# Patient Record
Sex: Female | Born: 1980 | Race: White | Hispanic: No | Marital: Single | Smoking: Current every day smoker
Health system: Southern US, Community
[De-identification: ages and names within clinical notes are randomized; demographics above are authoritative.]

## PROBLEM LIST (undated history)

## (undated) DIAGNOSIS — N809 Endometriosis, unspecified: Secondary | ICD-10-CM

## (undated) DIAGNOSIS — A630 Anogenital (venereal) warts: Secondary | ICD-10-CM

## (undated) DIAGNOSIS — E039 Hypothyroidism, unspecified: Secondary | ICD-10-CM

## (undated) DIAGNOSIS — E079 Disorder of thyroid, unspecified: Secondary | ICD-10-CM

## (undated) DIAGNOSIS — G8929 Other chronic pain: Secondary | ICD-10-CM

## (undated) DIAGNOSIS — F329 Major depressive disorder, single episode, unspecified: Secondary | ICD-10-CM

## (undated) DIAGNOSIS — F32A Depression, unspecified: Secondary | ICD-10-CM

## (undated) DIAGNOSIS — F319 Bipolar disorder, unspecified: Secondary | ICD-10-CM

## (undated) HISTORY — PX: OTHER SURGICAL HISTORY: SHX169

---

## 2008-11-01 ENCOUNTER — Emergency Department: Payer: Self-pay | Admitting: Emergency Medicine

## 2010-08-16 ENCOUNTER — Inpatient Hospital Stay: Payer: Self-pay | Admitting: Psychiatry

## 2011-02-20 ENCOUNTER — Emergency Department: Payer: Self-pay | Admitting: Internal Medicine

## 2011-02-23 ENCOUNTER — Emergency Department: Payer: Self-pay | Admitting: Emergency Medicine

## 2011-11-10 ENCOUNTER — Emergency Department: Payer: Self-pay | Admitting: Emergency Medicine

## 2012-02-08 ENCOUNTER — Inpatient Hospital Stay: Payer: Self-pay | Admitting: Psychiatry

## 2012-02-08 LAB — CBC
HCT: 42.5 % (ref 35.0–47.0)
HGB: 14.3 g/dL (ref 12.0–16.0)
MCV: 90 fL (ref 80–100)
WBC: 7.8 10*3/uL (ref 3.6–11.0)

## 2012-02-08 LAB — COMPREHENSIVE METABOLIC PANEL
Albumin: 3.9 g/dL (ref 3.4–5.0)
Alkaline Phosphatase: 65 U/L (ref 50–136)
Bilirubin,Total: 0.2 mg/dL (ref 0.2–1.0)
Chloride: 109 mmol/L — ABNORMAL HIGH (ref 98–107)
Creatinine: 0.8 mg/dL (ref 0.60–1.30)
EGFR (African American): >60
Osmolality: 289 (ref 275–301)
Potassium: 3.7 mmol/L (ref 3.5–5.1)
Sodium: 145 mmol/L (ref 136–145)
Total Protein: 7.8 g/dL (ref 6.4–8.2)

## 2012-02-08 LAB — DRUG SCREEN, URINE
Benzodiazepine, Ur Scrn: POSITIVE (ref ?–200)
Methadone, Ur Screen: NEGATIVE (ref ?–300)
Opiate, Ur Screen: NEGATIVE (ref ?–300)
Phencyclidine (PCP) Ur S: NEGATIVE (ref ?–25)

## 2012-02-08 LAB — ETHANOL
Ethanol %: <0.003 % (ref 0.000–0.080)
Ethanol: <3 mg/dL

## 2012-02-08 LAB — SALICYLATE LEVEL: Salicylates, Serum: 1.9 mg/dL

## 2012-02-08 LAB — ACETAMINOPHEN LEVEL: Acetaminophen: <2 ug/mL

## 2012-02-10 LAB — LIPID PANEL
Ldl Cholesterol, Calc: 56 mg/dL (ref 0–100)
Triglycerides: 189 mg/dL (ref 0–200)

## 2012-03-04 ENCOUNTER — Ambulatory Visit: Payer: Self-pay

## 2012-05-03 LAB — DRUG SCREEN, URINE
Amphetamines, Ur Screen: NEGATIVE (ref ?–1000)
Barbiturates, Ur Screen: NEGATIVE (ref ?–200)
Benzodiazepine, Ur Scrn: NEGATIVE (ref ?–200)
Cocaine Metabolite,Ur ~~LOC~~: NEGATIVE (ref ?–300)
MDMA (Ecstasy)Ur Screen: POSITIVE (ref ?–500)
Methadone, Ur Screen: NEGATIVE (ref ?–300)
Tricyclic, Ur Screen: NEGATIVE (ref ?–1000)

## 2012-05-03 LAB — ETHANOL: Ethanol %: <0.003 % (ref 0.000–0.080)

## 2012-05-03 LAB — CBC
HGB: 14.2 g/dL (ref 12.0–16.0)
MCH: 29.9 pg (ref 26.0–34.0)
MCHC: 34.2 g/dL (ref 32.0–36.0)
MCV: 87 fL (ref 80–100)
Platelet: 247 10*3/uL (ref 150–440)
RDW: 13.4 % (ref 11.5–14.5)
WBC: 6.9 10*3/uL (ref 3.6–11.0)

## 2012-05-03 LAB — COMPREHENSIVE METABOLIC PANEL
Albumin: 3.8 g/dL (ref 3.4–5.0)
Anion Gap: 3 — ABNORMAL LOW (ref 7–16)
Bilirubin,Total: 0.2 mg/dL (ref 0.2–1.0)
Creatinine: 0.93 mg/dL (ref 0.60–1.30)
EGFR (African American): >60
EGFR (Non-African Amer.): >60
SGPT (ALT): 24 U/L
Total Protein: 7.6 g/dL (ref 6.4–8.2)

## 2012-05-03 LAB — URINALYSIS, COMPLETE
Bilirubin,UR: NEGATIVE
Glucose,UR: NEGATIVE mg/dL (ref 0–75)
Ketone: NEGATIVE
Squamous Epithelial: NONE SEEN
WBC UR: NONE SEEN /HPF (ref 0–5)

## 2012-05-03 LAB — ACETAMINOPHEN LEVEL: Acetaminophen: <2 ug/mL

## 2012-05-05 ENCOUNTER — Inpatient Hospital Stay: Payer: Self-pay | Admitting: Psychiatry

## 2012-06-24 ENCOUNTER — Emergency Department: Payer: Self-pay | Admitting: Emergency Medicine

## 2012-06-24 LAB — COMPREHENSIVE METABOLIC PANEL
Anion Gap: 6 — ABNORMAL LOW (ref 7–16)
BUN: 25 mg/dL — ABNORMAL HIGH (ref 7–18)
Chloride: 103 mmol/L (ref 98–107)
Creatinine: 0.93 mg/dL (ref 0.60–1.30)
EGFR (African American): >60
Potassium: 4.1 mmol/L (ref 3.5–5.1)
Total Protein: 8.2 g/dL (ref 6.4–8.2)

## 2012-06-24 LAB — URINALYSIS, COMPLETE
Bilirubin,UR: NEGATIVE
Ketone: NEGATIVE
Protein: NEGATIVE
RBC,UR: NONE SEEN /HPF (ref 0–5)
WBC UR: <1 /HPF (ref 0–5)

## 2012-06-24 LAB — PREGNANCY, URINE: Pregnancy Test, Urine: NEGATIVE m[IU]/mL

## 2012-06-24 LAB — CBC
Platelet: 251 10*3/uL (ref 150–440)
RDW: 13.9 % (ref 11.5–14.5)
WBC: 7.8 10*3/uL (ref 3.6–11.0)

## 2012-06-24 LAB — LIPASE, BLOOD: Lipase: 215 U/L (ref 73–393)

## 2012-06-26 ENCOUNTER — Emergency Department: Payer: Self-pay | Admitting: Emergency Medicine

## 2012-06-26 LAB — CBC WITH DIFFERENTIAL/PLATELET
Basophil #: 0 10*3/uL (ref 0.0–0.1)
Eosinophil %: 7.4 %
Lymphocyte #: 1.6 10*3/uL (ref 1.0–3.6)
MCH: 30.5 pg (ref 26.0–34.0)
MCV: 91 fL (ref 80–100)
Monocyte #: 0.4 x10 3/mm (ref 0.2–0.9)
Platelet: 217 10*3/uL (ref 150–440)
RDW: 13.8 % (ref 11.5–14.5)

## 2012-06-26 LAB — BASIC METABOLIC PANEL
BUN: 12 mg/dL (ref 7–18)
Creatinine: 0.84 mg/dL (ref 0.60–1.30)
EGFR (Non-African Amer.): >60
Glucose: 87 mg/dL (ref 65–99)
Osmolality: 279 (ref 275–301)
Potassium: 3.9 mmol/L (ref 3.5–5.1)
Sodium: 140 mmol/L (ref 136–145)

## 2012-06-26 LAB — HEPATIC FUNCTION PANEL A (ARMC)
Albumin: 3.7 g/dL (ref 3.4–5.0)
Bilirubin, Direct: <0.1 mg/dL (ref 0.00–0.20)

## 2012-06-26 LAB — URINALYSIS, COMPLETE
Bacteria: NONE SEEN
Blood: NEGATIVE
Glucose,UR: NEGATIVE mg/dL (ref 0–75)
Nitrite: NEGATIVE
Ph: 6 (ref 4.5–8.0)
Specific Gravity: 1.019 (ref 1.003–1.030)
Squamous Epithelial: 3

## 2012-07-09 ENCOUNTER — Inpatient Hospital Stay: Payer: Self-pay | Admitting: Psychiatry

## 2012-07-09 LAB — COMPREHENSIVE METABOLIC PANEL
Alkaline Phosphatase: 92 U/L (ref 50–136)
BUN: 13 mg/dL (ref 7–18)
Bilirubin,Total: 0.6 mg/dL (ref 0.2–1.0)
Chloride: 105 mmol/L (ref 98–107)
Creatinine: 0.87 mg/dL (ref 0.60–1.30)
SGPT (ALT): 60 U/L (ref 12–78)
Total Protein: 8.1 g/dL (ref 6.4–8.2)

## 2012-07-09 LAB — CBC
HGB: 15 g/dL (ref 12.0–16.0)
MCH: 30.7 pg (ref 26.0–34.0)
MCHC: 34.4 g/dL (ref 32.0–36.0)

## 2012-07-09 LAB — ETHANOL
Ethanol %: <0.003 % (ref 0.000–0.080)
Ethanol: <3 mg/dL

## 2012-07-09 LAB — URINALYSIS, COMPLETE
Hyaline Cast: 1
Nitrite: NEGATIVE
Protein: 30
RBC,UR: 2 /HPF (ref 0–5)
Squamous Epithelial: 10

## 2012-07-09 LAB — DRUG SCREEN, URINE
Amphetamines, Ur Screen: NEGATIVE (ref ?–1000)
Cannabinoid 50 Ng, Ur ~~LOC~~: NEGATIVE (ref ?–50)
Phencyclidine (PCP) Ur S: NEGATIVE (ref ?–25)

## 2012-07-09 LAB — TSH: Thyroid Stimulating Horm: 52.9 u[IU]/mL — ABNORMAL HIGH

## 2012-07-09 LAB — PREGNANCY, URINE: Pregnancy Test, Urine: NEGATIVE m[IU]/mL

## 2012-07-14 LAB — COMPREHENSIVE METABOLIC PANEL
BUN: 17 mg/dL (ref 7–18)
Bilirubin,Total: 0.3 mg/dL (ref 0.2–1.0)
Chloride: 102 mmol/L (ref 98–107)
Creatinine: 0.73 mg/dL (ref 0.60–1.30)
EGFR (African American): >60
EGFR (Non-African Amer.): >60
Potassium: 3.8 mmol/L (ref 3.5–5.1)
SGPT (ALT): 92 U/L — ABNORMAL HIGH (ref 12–78)
Total Protein: 7.3 g/dL (ref 6.4–8.2)

## 2012-07-14 LAB — CARBAMAZEPINE LEVEL, TOTAL: Carbamazepine: 15.3 ug/mL (ref 4.0–12.0)

## 2012-07-14 LAB — CBC WITH DIFFERENTIAL/PLATELET
Basophil #: 0.1 10*3/uL (ref 0.0–0.1)
Lymphocyte #: 1.9 10*3/uL (ref 1.0–3.6)
Lymphocyte %: 35 %
Monocyte %: 7.5 %
Neutrophil %: 47.3 %
Platelet: 245 10*3/uL (ref 150–440)
RDW: 13.5 % (ref 11.5–14.5)
WBC: 5.5 10*3/uL (ref 3.6–11.0)

## 2012-07-15 LAB — HEPATIC FUNCTION PANEL A (ARMC)
Albumin: 3.8 g/dL (ref 3.4–5.0)
Alkaline Phosphatase: 107 U/L (ref 50–136)
Bilirubin, Direct: <0.1 mg/dL (ref 0.00–0.20)
Total Protein: 7.9 g/dL (ref 6.4–8.2)

## 2012-09-01 LAB — CBC
HCT: 43.3 % (ref 35.0–47.0)
HGB: 15.2 g/dL (ref 12.0–16.0)
MCH: 30.8 pg (ref 26.0–34.0)
MCHC: 35.1 g/dL (ref 32.0–36.0)
MCV: 88 fL (ref 80–100)
Platelet: 284 10*3/uL (ref 150–440)
RBC: 4.94 10*6/uL (ref 3.80–5.20)
RDW: 13.4 % (ref 11.5–14.5)
WBC: 7.1 10*3/uL (ref 3.6–11.0)

## 2012-09-01 LAB — COMPREHENSIVE METABOLIC PANEL
BUN: 14 mg/dL (ref 7–18)
Bilirubin,Total: 0.3 mg/dL (ref 0.2–1.0)
Chloride: 111 mmol/L — ABNORMAL HIGH (ref 98–107)
Creatinine: 0.79 mg/dL (ref 0.60–1.30)
SGPT (ALT): 33 U/L (ref 12–78)
Total Protein: 7.9 g/dL (ref 6.4–8.2)

## 2012-09-01 LAB — HCG, QUANTITATIVE, PREGNANCY: Beta Hcg, Quant.: <1 m[IU]/mL — ABNORMAL LOW

## 2012-09-01 LAB — SALICYLATE LEVEL: Salicylates, Serum: 3.6 mg/dL — ABNORMAL HIGH

## 2012-09-01 LAB — ETHANOL
Ethanol %: <0.003 % (ref 0.000–0.080)
Ethanol: <3 mg/dL

## 2012-09-01 LAB — ACETAMINOPHEN LEVEL: Acetaminophen: <2 ug/mL

## 2012-09-02 ENCOUNTER — Inpatient Hospital Stay: Payer: Self-pay | Admitting: Psychiatry

## 2012-09-02 LAB — DRUG SCREEN, URINE
Amphetamines, Ur Screen: NEGATIVE (ref ?–1000)
Barbiturates, Ur Screen: NEGATIVE (ref ?–200)
Phencyclidine (PCP) Ur S: NEGATIVE (ref ?–25)
Tricyclic, Ur Screen: NEGATIVE (ref ?–1000)

## 2012-09-02 LAB — URINALYSIS, COMPLETE
Bacteria: NONE SEEN
Bilirubin,UR: NEGATIVE
Blood: NEGATIVE
Glucose,UR: NEGATIVE mg/dL (ref 0–75)
Ph: 8 (ref 4.5–8.0)
Specific Gravity: 1.009 (ref 1.003–1.030)
Squamous Epithelial: 1

## 2012-12-16 LAB — URINALYSIS, COMPLETE
Bacteria: NONE SEEN
Glucose,UR: NEGATIVE mg/dL (ref 0–75)
Nitrite: NEGATIVE
Ph: 5 (ref 4.5–8.0)
Protein: NEGATIVE
RBC,UR: 1 /HPF (ref 0–5)

## 2012-12-16 LAB — COMPREHENSIVE METABOLIC PANEL
Alkaline Phosphatase: 108 U/L (ref 50–136)
BUN: 15 mg/dL (ref 7–18)
Calcium, Total: 8.9 mg/dL (ref 8.5–10.1)
Chloride: 111 mmol/L — ABNORMAL HIGH (ref 98–107)
Creatinine: 0.93 mg/dL (ref 0.60–1.30)
EGFR (African American): >60
EGFR (Non-African Amer.): >60
Glucose: 88 mg/dL (ref 65–99)
Osmolality: 280 (ref 275–301)
Potassium: 3.8 mmol/L (ref 3.5–5.1)
SGOT(AST): 19 U/L (ref 15–37)
SGPT (ALT): 30 U/L (ref 12–78)
Sodium: 140 mmol/L (ref 136–145)
Total Protein: 8.3 g/dL — ABNORMAL HIGH (ref 6.4–8.2)

## 2012-12-16 LAB — CBC
HGB: 15.2 g/dL (ref 12.0–16.0)
MCH: 29.8 pg (ref 26.0–34.0)
MCHC: 33.7 g/dL (ref 32.0–36.0)
MCV: 88 fL (ref 80–100)
RDW: 13.4 % (ref 11.5–14.5)

## 2012-12-16 LAB — ACETAMINOPHEN LEVEL: Acetaminophen: <2 ug/mL

## 2012-12-16 LAB — ETHANOL
Ethanol %: <0.003 % (ref 0.000–0.080)
Ethanol: <3 mg/dL

## 2012-12-16 LAB — PREGNANCY, URINE: Pregnancy Test, Urine: NEGATIVE m[IU]/mL

## 2012-12-16 LAB — DRUG SCREEN, URINE
Barbiturates, Ur Screen: NEGATIVE (ref ?–200)
Benzodiazepine, Ur Scrn: NEGATIVE (ref ?–200)
Methadone, Ur Screen: NEGATIVE (ref ?–300)

## 2012-12-16 LAB — SALICYLATE LEVEL: Salicylates, Serum: 3.9 mg/dL — ABNORMAL HIGH

## 2012-12-17 ENCOUNTER — Inpatient Hospital Stay: Payer: Self-pay | Admitting: Psychiatry

## 2013-01-31 ENCOUNTER — Emergency Department: Payer: Self-pay | Admitting: Internal Medicine

## 2013-01-31 LAB — CBC WITH DIFFERENTIAL/PLATELET
Basophil #: 0.1 10*3/uL (ref 0.0–0.1)
Basophil %: 1.1 %
Eosinophil %: 5 %
HCT: 40.8 % (ref 35.0–47.0)
HGB: 14.2 g/dL (ref 12.0–16.0)
Lymphocyte #: 1.9 10*3/uL (ref 1.0–3.6)
Lymphocyte %: 23.6 %
MCH: 30 pg (ref 26.0–34.0)
MCHC: 34.7 g/dL (ref 32.0–36.0)
Monocyte #: 0.6 x10 3/mm (ref 0.2–0.9)
Monocyte %: 8 %
Neutrophil %: 62.3 %
Platelet: 270 10*3/uL (ref 150–440)
RBC: 4.73 10*6/uL (ref 3.80–5.20)
RDW: 13 % (ref 11.5–14.5)
WBC: 8.1 10*3/uL (ref 3.6–11.0)

## 2013-01-31 LAB — COMPREHENSIVE METABOLIC PANEL
Albumin: 3.9 g/dL (ref 3.4–5.0)
Alkaline Phosphatase: 105 U/L (ref 50–136)
Anion Gap: 4 — ABNORMAL LOW (ref 7–16)
Calcium, Total: 8.8 mg/dL (ref 8.5–10.1)
Chloride: 109 mmol/L — ABNORMAL HIGH (ref 98–107)
Co2: 26 mmol/L (ref 21–32)
EGFR (African American): >60
EGFR (Non-African Amer.): >60
Osmolality: 277 (ref 275–301)
Potassium: 3.5 mmol/L (ref 3.5–5.1)
Sodium: 139 mmol/L (ref 136–145)
Total Protein: 7.9 g/dL (ref 6.4–8.2)

## 2013-02-13 LAB — CBC
HCT: 42.3 % (ref 35.0–47.0)
HGB: 14.4 g/dL (ref 12.0–16.0)
MCH: 29.6 pg (ref 26.0–34.0)
MCHC: 34 g/dL (ref 32.0–36.0)
Platelet: 255 10*3/uL (ref 150–440)
WBC: 7.1 10*3/uL (ref 3.6–11.0)

## 2013-02-13 LAB — COMPREHENSIVE METABOLIC PANEL
Albumin: 3.5 g/dL (ref 3.4–5.0)
Anion Gap: 6 — ABNORMAL LOW (ref 7–16)
BUN: 18 mg/dL (ref 7–18)
Bilirubin,Total: 0.2 mg/dL (ref 0.2–1.0)
Calcium, Total: 8.7 mg/dL (ref 8.5–10.1)
Chloride: 113 mmol/L — ABNORMAL HIGH (ref 98–107)
Potassium: 3.7 mmol/L (ref 3.5–5.1)
SGPT (ALT): 21 U/L (ref 12–78)

## 2013-02-13 LAB — URINALYSIS, COMPLETE
Bacteria: NONE SEEN
Bilirubin,UR: NEGATIVE
Blood: NEGATIVE
Ketone: NEGATIVE
Leukocyte Esterase: NEGATIVE
Nitrite: NEGATIVE
Protein: NEGATIVE
RBC,UR: 1 /HPF (ref 0–5)
Squamous Epithelial: NONE SEEN
WBC UR: <1 /HPF (ref 0–5)

## 2013-02-13 LAB — TSH: Thyroid Stimulating Horm: 10.8 u[IU]/mL — ABNORMAL HIGH

## 2013-02-13 LAB — DRUG SCREEN, URINE
Amphetamines, Ur Screen: NEGATIVE (ref ?–1000)
Benzodiazepine, Ur Scrn: NEGATIVE (ref ?–200)
Cocaine Metabolite,Ur ~~LOC~~: NEGATIVE (ref ?–300)
Methadone, Ur Screen: NEGATIVE (ref ?–300)
Phencyclidine (PCP) Ur S: NEGATIVE (ref ?–25)
Tricyclic, Ur Screen: NEGATIVE (ref ?–1000)

## 2013-02-13 LAB — ETHANOL
Ethanol %: <0.003 % (ref 0.000–0.080)
Ethanol: <3 mg/dL

## 2013-02-14 ENCOUNTER — Inpatient Hospital Stay: Payer: Self-pay | Admitting: Psychiatry

## 2013-03-10 ENCOUNTER — Emergency Department: Payer: Self-pay | Admitting: Emergency Medicine

## 2013-03-10 LAB — DRUG SCREEN, URINE
Amphetamines, Ur Screen: NEGATIVE (ref ?–1000)
Barbiturates, Ur Screen: NEGATIVE (ref ?–200)
Cannabinoid 50 Ng, Ur ~~LOC~~: NEGATIVE (ref ?–50)
Cocaine Metabolite,Ur ~~LOC~~: NEGATIVE (ref ?–300)
MDMA (Ecstasy)Ur Screen: POSITIVE (ref ?–500)
Methadone, Ur Screen: NEGATIVE (ref ?–300)
Opiate, Ur Screen: POSITIVE (ref ?–300)

## 2013-03-10 LAB — URINALYSIS, COMPLETE
Bacteria: NONE SEEN
Bilirubin,UR: NEGATIVE
Blood: NEGATIVE
Nitrite: NEGATIVE
Ph: 5 (ref 4.5–8.0)
Protein: NEGATIVE
RBC,UR: 1 /HPF (ref 0–5)
Specific Gravity: 1.016 (ref 1.003–1.030)

## 2013-03-10 LAB — COMPREHENSIVE METABOLIC PANEL
Albumin: 3.8 g/dL (ref 3.4–5.0)
Anion Gap: 5 — ABNORMAL LOW (ref 7–16)
BUN: 17 mg/dL (ref 7–18)
Bilirubin,Total: 0.2 mg/dL (ref 0.2–1.0)
Calcium, Total: 9 mg/dL (ref 8.5–10.1)
Chloride: 107 mmol/L (ref 98–107)
Creatinine: 0.84 mg/dL (ref 0.60–1.30)
EGFR (Non-African Amer.): >60
Glucose: 80 mg/dL (ref 65–99)
Osmolality: 280 (ref 275–301)
SGOT(AST): 33 U/L (ref 15–37)
SGPT (ALT): 69 U/L (ref 12–78)

## 2013-03-10 LAB — CBC
HCT: 42.3 % (ref 35.0–47.0)
HGB: 14.3 g/dL (ref 12.0–16.0)
MCH: 29 pg (ref 26.0–34.0)
MCV: 86 fL (ref 80–100)
Platelet: 260 10*3/uL (ref 150–440)
RBC: 4.92 10*6/uL (ref 3.80–5.20)
RDW: 13.7 % (ref 11.5–14.5)
WBC: 9.2 10*3/uL (ref 3.6–11.0)

## 2013-03-10 LAB — PREGNANCY, URINE: Pregnancy Test, Urine: NEGATIVE m[IU]/mL

## 2013-03-10 LAB — TSH: Thyroid Stimulating Horm: 19.6 u[IU]/mL — ABNORMAL HIGH

## 2013-03-10 LAB — SALICYLATE LEVEL: Salicylates, Serum: 3.2 mg/dL — ABNORMAL HIGH

## 2013-03-15 ENCOUNTER — Inpatient Hospital Stay: Payer: Self-pay | Admitting: Psychiatry

## 2013-03-15 LAB — COMPREHENSIVE METABOLIC PANEL
Albumin: 3.7 g/dL (ref 3.4–5.0)
Anion Gap: 6 — ABNORMAL LOW (ref 7–16)
BUN: 11 mg/dL (ref 7–18)
Bilirubin,Total: 0.4 mg/dL (ref 0.2–1.0)
Chloride: 112 mmol/L — ABNORMAL HIGH (ref 98–107)
Co2: 22 mmol/L (ref 21–32)
EGFR (Non-African Amer.): >60
Potassium: 3.5 mmol/L (ref 3.5–5.1)
SGOT(AST): 20 U/L (ref 15–37)
Sodium: 140 mmol/L (ref 136–145)

## 2013-03-15 LAB — DRUG SCREEN, URINE
Benzodiazepine, Ur Scrn: NEGATIVE (ref ?–200)
Cannabinoid 50 Ng, Ur ~~LOC~~: NEGATIVE (ref ?–50)
Cocaine Metabolite,Ur ~~LOC~~: NEGATIVE (ref ?–300)
MDMA (Ecstasy)Ur Screen: POSITIVE (ref ?–500)
Methadone, Ur Screen: NEGATIVE (ref ?–300)
Opiate, Ur Screen: NEGATIVE (ref ?–300)
Phencyclidine (PCP) Ur S: NEGATIVE (ref ?–25)

## 2013-03-15 LAB — CBC WITH DIFFERENTIAL/PLATELET
Basophil #: 0.1 10*3/uL (ref 0.0–0.1)
Eosinophil #: 0.1 10*3/uL (ref 0.0–0.7)
Eosinophil %: 1 %
HCT: 42.5 % (ref 35.0–47.0)
HGB: 14.3 g/dL (ref 12.0–16.0)
Lymphocyte #: 1.4 10*3/uL (ref 1.0–3.6)
Lymphocyte %: 16 %
MCH: 29.1 pg (ref 26.0–34.0)
MCHC: 33.7 g/dL (ref 32.0–36.0)
MCV: 86 fL (ref 80–100)
Monocyte %: 7.9 %
Platelet: 255 10*3/uL (ref 150–440)
RBC: 4.92 10*6/uL (ref 3.80–5.20)

## 2013-03-15 LAB — URINALYSIS, COMPLETE
Bacteria: NONE SEEN
Glucose,UR: NEGATIVE mg/dL (ref 0–75)
Ketone: NEGATIVE
Leukocyte Esterase: NEGATIVE
Nitrite: NEGATIVE
Ph: 6 (ref 4.5–8.0)
Protein: NEGATIVE
RBC,UR: <1 /HPF (ref 0–5)
Specific Gravity: 1.006 (ref 1.003–1.030)

## 2013-03-15 LAB — SALICYLATE LEVEL: Salicylates, Serum: 9.1 mg/dL — ABNORMAL HIGH

## 2013-03-15 LAB — ETHANOL: Ethanol %: <0.003 % (ref 0.000–0.080)

## 2013-03-15 LAB — TSH: Thyroid Stimulating Horm: 5.64 u[IU]/mL — ABNORMAL HIGH

## 2013-03-22 LAB — URINALYSIS, COMPLETE
Bilirubin,UR: NEGATIVE
Blood: NEGATIVE
Nitrite: NEGATIVE
Ph: 8 (ref 4.5–8.0)
Protein: NEGATIVE
Specific Gravity: 1.01 (ref 1.003–1.030)
Squamous Epithelial: <1

## 2013-05-04 ENCOUNTER — Emergency Department (HOSPITAL_COMMUNITY): Payer: Medicare Other

## 2013-05-04 ENCOUNTER — Encounter (HOSPITAL_COMMUNITY): Payer: Self-pay | Admitting: *Deleted

## 2013-05-04 ENCOUNTER — Emergency Department (HOSPITAL_COMMUNITY)
Admission: EM | Admit: 2013-05-04 | Discharge: 2013-05-04 | Disposition: A | Discharge status: Home / Self Care | Payer: Medicare Other | Attending: Emergency Medicine | Admitting: Emergency Medicine

## 2013-05-04 DIAGNOSIS — S93401A Sprain of unspecified ligament of right ankle, initial encounter: Secondary | ICD-10-CM

## 2013-05-04 DIAGNOSIS — Z8639 Personal history of other endocrine, nutritional and metabolic disease: Secondary | ICD-10-CM | POA: Insufficient documentation

## 2013-05-04 DIAGNOSIS — Y929 Unspecified place or not applicable: Secondary | ICD-10-CM | POA: Insufficient documentation

## 2013-05-04 DIAGNOSIS — Z862 Personal history of diseases of the blood and blood-forming organs and certain disorders involving the immune mechanism: Secondary | ICD-10-CM | POA: Insufficient documentation

## 2013-05-04 DIAGNOSIS — Z79899 Other long term (current) drug therapy: Secondary | ICD-10-CM | POA: Insufficient documentation

## 2013-05-04 DIAGNOSIS — S93409A Sprain of unspecified ligament of unspecified ankle, initial encounter: Secondary | ICD-10-CM | POA: Insufficient documentation

## 2013-05-04 DIAGNOSIS — Y9389 Activity, other specified: Secondary | ICD-10-CM | POA: Insufficient documentation

## 2013-05-04 DIAGNOSIS — F17200 Nicotine dependence, unspecified, uncomplicated: Secondary | ICD-10-CM | POA: Insufficient documentation

## 2013-05-04 DIAGNOSIS — IMO0002 Reserved for concepts with insufficient information to code with codable children: Secondary | ICD-10-CM | POA: Insufficient documentation

## 2013-05-04 DIAGNOSIS — F319 Bipolar disorder, unspecified: Secondary | ICD-10-CM | POA: Insufficient documentation

## 2013-05-04 HISTORY — DX: Bipolar disorder, unspecified: F31.9

## 2013-05-04 HISTORY — DX: Disorder of thyroid, unspecified: E07.9

## 2013-05-04 MED ORDER — IBUPROFEN 800 MG PO TABS: 800.0000 mg | ORAL_TABLET | Freq: Three times a day (TID) | ORAL | Status: DC

## 2013-05-04 NOTE — ED Provider Notes (Signed)
History    CSN: [id number] Arrival date & time 05/04/13  1403  First MD Initiated Contact with Patient 05/04/13 1454     Chief Complaint  Patient presents with  . Ankle Pain   (Consider location/radiation/quality/duration/timing/severity/associated sxs/prior Treatment) Patient is a 32 y.o. female presenting with ankle pain. The history is provided by the patient.  Ankle Pain Location:  Ankle Time since incident:  2 days Injury: yes   Mechanism of injury comment:  Direct blow Ankle location:  R ankle Pain details:    Quality:  Aching and sharp   Radiates to:  Does not radiate   Severity:  Moderate   Onset quality:  Sudden   Timing:  Constant   Progression:  Unchanged Chronicity:  New Dislocation: no   Foreign body present:  No foreign bodies Prior injury to area:  No Relieved by:  Nothing Worsened by:  Activity, bearing weight and rotation Ineffective treatments:  Acetaminophen Associated symptoms: no back pain, no decreased ROM, no fatigue, no fever, no neck pain, no numbness, no swelling and no tingling    Past Medical History  Diagnosis Date  . Thyroid disease   . Bipolar 1 disorder    History reviewed. No pertinent past surgical history. History reviewed. No pertinent family history. History  Substance Use Topics  . Smoking status: Current Every Day Smoker  . Smokeless tobacco: Not on file  . Alcohol Use: No   OB History   Grav Para Term Preterm Abortions TAB SAB Ect Mult Living                 Review of Systems  Constitutional: Negative for fever, chills and fatigue.  HENT: Negative for neck pain.   Genitourinary: Negative for dysuria and difficulty urinating.  Musculoskeletal: Positive for joint swelling and arthralgias. Negative for back pain.  Skin: Negative for color change and wound.  All other systems reviewed and are negative.    Allergies  Tomato  Home Medications   Current Outpatient Rx  Name  Route  Sig  Dispense  Refill  .  buPROPion (WELLBUTRIN SR) 100 MG 12 hr tablet   Oral   Take 100 mg by mouth 2 (two) times daily.         Marland Kitchen gabapentin (NEURONTIN) 300 MG capsule   Oral   Take 300 mg by mouth 4 (four) times daily.         . sertraline (ZOLOFT) 100 MG tablet   Oral   Take 100 mg by mouth daily.         Marland Kitchen topiramate (TOPAMAX) 200 MG tablet   Oral   Take 200 mg by mouth 2 (two) times daily.          BP 112/74  Pulse 82  Temp(Src) 98.3 F (36.8 C) (Oral)  Resp 18  Ht 5\' 7"  (1.702 m)  Wt 200 lb (90.719 kg)  BMI 31.32 kg/m2  SpO2 99%  LMP 05/03/2013 Physical Exam  Nursing note and vitals reviewed. Constitutional: She is oriented to person, place, and time. She appears well-developed and well-nourished. No distress.  HENT:  Head: Normocephalic and atraumatic.  Cardiovascular: Normal rate, regular rhythm, normal heart sounds and intact distal pulses.   No murmur heard. Pulmonary/Chest: Effort normal and breath sounds normal. No respiratory distress.  Musculoskeletal: She exhibits tenderness. She exhibits no edema.  Right lateral ankle is ttp, ROM is preserved.  DP pulse is brisk, distal sensation intact.  No erythema, abrasion, bruising , edema or  deformity. No proximal tenderness   Neurological: She is alert and oriented to person, place, and time. She exhibits normal muscle tone. Coordination normal.  Skin: Skin is warm and dry.    ED Course  Procedures (including critical care time) Labs Reviewed - No data to display Dg Ankle Complete Right  05/04/2013   *RADIOLOGY REPORT*  Clinical Data: Injury 2 days ago pain and swelling with tingling of his toes  RIGHT ANKLE - COMPLETE 3+ VIEW  Comparison: None.  Findings: Three views of the right ankle submitted.  No acute fracture or subluxation.  Mild soft tissue swelling adjacent to lateral malleolus.  Small well corticated fragment adjacent to distal fibula most likely due to prior injury.  IMPRESSION: No acute fracture or subluxation.  Mild  lateral soft tissue swelling.   Original Report Authenticated By: Natasha Mead, M.D.     MDM   ASO applied and crutches given.  Pain improved, remains NV intact.  Referral given for orthopedics   ttp of the lateral right ankle.  No edema , bruising, or proximal tenderness.  Likely contusion.  Agrees to elevate and ice, ibuprofen for pain  Cedar Ditullio L. Trisha Mangle, PA-C 05/05/13 2135

## 2013-05-04 NOTE — ED Notes (Addendum)
Rt ankle pain, for 2 days, Pt was in a rocking chair and hit lat aspect of ankle against rocker,  Pt is recovering addict from [facility],   The person that brought her says no narcotics or tramadol

## 2013-05-06 NOTE — ED Provider Notes (Signed)
Medical screening examination/treatment/procedure(s) were performed by non-physician practitioner and as supervising physician I was immediately available for consultation/collaboration. Blythe Veach, MD, FACEP   Geral Coker L Reonna Finlayson, MD 05/06/13 1221 

## 2013-06-03 ENCOUNTER — Emergency Department (HOSPITAL_COMMUNITY)
Admission: EM | Admit: 2013-06-03 | Discharge: 2013-06-04 | Disposition: A | Discharge status: Home / Self Care | Payer: Medicare Other | Source: Home / Self Care | Attending: Emergency Medicine | Admitting: Emergency Medicine

## 2013-06-03 ENCOUNTER — Encounter (HOSPITAL_COMMUNITY): Payer: Self-pay | Admitting: Emergency Medicine

## 2013-06-03 DIAGNOSIS — F32A Depression, unspecified: Secondary | ICD-10-CM

## 2013-06-03 DIAGNOSIS — F329 Major depressive disorder, single episode, unspecified: Secondary | ICD-10-CM

## 2013-06-03 DIAGNOSIS — R45851 Suicidal ideations: Secondary | ICD-10-CM

## 2013-06-03 LAB — COMPREHENSIVE METABOLIC PANEL
ALT: 41 U/L — ABNORMAL HIGH (ref 0–35)
AST: 19 U/L (ref 0–37)
Albumin: 4 g/dL (ref 3.5–5.2)
Alkaline Phosphatase: 87 U/L (ref 39–117)
BUN: 10 mg/dL (ref 6–23)
Chloride: 104 mEq/L (ref 96–112)
Potassium: 3.2 mEq/L — ABNORMAL LOW (ref 3.5–5.1)
Sodium: 138 mEq/L (ref 135–145)
Total Bilirubin: 0.3 mg/dL (ref 0.3–1.2)

## 2013-06-03 LAB — POCT PREGNANCY, URINE: Preg Test, Ur: NEGATIVE

## 2013-06-03 LAB — CBC
HCT: 41.3 % (ref 36.0–46.0)
Platelets: 272 10*3/uL (ref 150–400)
RDW: 13.7 % (ref 11.5–15.5)
WBC: 7.2 10*3/uL (ref 4.0–10.5)

## 2013-06-03 LAB — RAPID URINE DRUG SCREEN, HOSP PERFORMED
Amphetamines: NOT DETECTED
Barbiturates: NOT DETECTED
Benzodiazepines: NOT DETECTED

## 2013-06-03 LAB — ACETAMINOPHEN LEVEL: Acetaminophen (Tylenol), Serum: <15 ug/mL (ref 10–30)

## 2013-06-03 MED ORDER — TOPIRAMATE 100 MG PO TABS
200.0000 mg | ORAL_TABLET | Freq: Two times a day (BID) | ORAL | Status: DC
  Administered 2013-06-04 (×2): 200 mg via ORAL
  Filled 2013-06-03 (×6): qty 2

## 2013-06-03 MED ORDER — SERTRALINE HCL 50 MG PO TABS
100.0000 mg | ORAL_TABLET | Freq: Every day | ORAL | Status: DC
  Administered 2013-06-04: 100 mg via ORAL
  Filled 2013-06-03 (×2): qty 1

## 2013-06-03 MED ORDER — HYDROXYZINE HCL 25 MG PO TABS
50.0000 mg | ORAL_TABLET | Freq: Two times a day (BID) | ORAL | Status: DC
  Administered 2013-06-04 (×2): 50 mg via ORAL
  Filled 2013-06-03 (×2): qty 2

## 2013-06-03 MED ORDER — LORAZEPAM 1 MG PO TABS
2.0000 mg | ORAL_TABLET | Freq: Four times a day (QID) | ORAL | Status: DC | PRN
  Administered 2013-06-04: 2 mg via ORAL
  Filled 2013-06-03: qty 2

## 2013-06-03 MED ORDER — LEVOTHYROXINE SODIUM 112 MCG PO TABS
112.0000 ug | ORAL_TABLET | Freq: Every day | ORAL | Status: DC
  Administered 2013-06-04: 112 ug via ORAL
  Filled 2013-06-03 (×3): qty 1

## 2013-06-03 MED ORDER — BUPROPION HCL ER (SR) 100 MG PO TB12
100.0000 mg | ORAL_TABLET | Freq: Two times a day (BID) | ORAL | Status: DC
  Administered 2013-06-04 (×2): 100 mg via ORAL
  Filled 2013-06-03 (×6): qty 1

## 2013-06-03 NOTE — ED Notes (Signed)
Pt does make jokes that are appropriate at times and laughs. Pt will pull things out of bag and will say" woo".

## 2013-06-03 NOTE — ED Provider Notes (Signed)
CSN: [id number]     Arrival date & time 06/03/13  1732 History    This chart was scribed for Gilda Crease, * by Donne Anon, ED Scribe. This patient was seen in room [facility]15[facility] and the patient's care was started at 1746.   First MD Initiated Contact with Patient 06/03/13 1746     Chief Complaint  Patient presents with  . Medical Clearance    The history is provided by the patient. No language interpreter was used.   HPI Comments: Kelsey Maldonado is a 32 y.o. female with hx of bipolar 1 disorder and thyroid disease, who presents to the Emergency Department needing medical clearance. She endorses SI and HI, and she would throw does someone in particular down the stairs. She endorses auditory hallucinations, and states she hears voices that are arguing but not telling to hurt herself or others. She recently has changed her medication. She states she has slept 6 hours in the past 5 days. She denies visual hallucinations. She has been hospitalized several times before for her bipolar disorder.  Past Medical History  Diagnosis Date  . Thyroid disease   . Bipolar 1 disorder    History reviewed. No pertinent past surgical history. History reviewed. No pertinent family history. History  Substance Use Topics  . Smoking status: Current Every Day Smoker  . Smokeless tobacco: Not on file  . Alcohol Use: No   OB History   Grav Para Term Preterm Abortions TAB SAB Ect Mult Living                 Review of Systems  Psychiatric/Behavioral: Positive for suicidal ideas and hallucinations.  All other systems reviewed and are negative.    Allergies  Tomato  Home Medications   Current Outpatient Rx  Name  Route  Sig  Dispense  Refill  . buPROPion (WELLBUTRIN SR) 100 MG 12 hr tablet   Oral   Take 100 mg by mouth 2 (two) times daily.         Marland Kitchen gabapentin (NEURONTIN) 300 MG capsule   Oral   Take 300 mg by mouth 4 (four) times daily.         Marland Kitchen ibuprofen (ADVIL,MOTRIN) 800 MG  tablet   Oral   Take 1 tablet (800 mg total) by mouth 3 (three) times daily.   21 tablet   0   . sertraline (ZOLOFT) 100 MG tablet   Oral   Take 100 mg by mouth daily.         Marland Kitchen topiramate (TOPAMAX) 200 MG tablet   Oral   Take 200 mg by mouth 2 (two) times daily.          BP 115/82  Pulse 100  Temp(Src) 97.9 F (36.6 C) (Oral)  Resp 20  Ht 5\' 7"  (1.702 m)  Wt 180 lb (81.647 kg)  BMI 28.19 kg/m2  SpO2 98%  LMP 06/02/2013  Physical Exam  Constitutional: She is oriented to person, place, and time. She appears well-developed and well-nourished. No distress.  HENT:  Head: Normocephalic and atraumatic.  Right Ear: Hearing normal.  Left Ear: Hearing normal.  Nose: Nose normal.  Mouth/Throat: Oropharynx is clear and moist and mucous membranes are normal.  Eyes: Conjunctivae and EOM are normal. Pupils are equal, round, and reactive to light.  Neck: Normal range of motion. Neck supple.  Cardiovascular: Regular rhythm, S1 normal and S2 normal.  Exam reveals no gallop and no friction rub.   No murmur heard. Pulmonary/Chest:  Effort normal and breath sounds normal. No respiratory distress. She exhibits no tenderness.  Abdominal: Soft. Normal appearance and bowel sounds are normal. There is no hepatosplenomegaly. There is no tenderness. There is no rebound, no guarding, no tenderness at McBurney's point and negative Murphy's sign. No hernia.  Musculoskeletal: Normal range of motion.  Neurological: She is alert and oriented to person, place, and time. She has normal strength. No cranial nerve deficit or sensory deficit. Coordination normal. GCS eye subscore is 4. GCS verbal subscore is 5. GCS motor subscore is 6.  Skin: Skin is warm, dry and intact. No rash noted. No cyanosis.  Psychiatric: She has a normal mood and affect. Her speech is normal. She is actively hallucinating. She expresses homicidal and suicidal ideation. She expresses homicidal plans. She expresses no suicidal plans.     ED Course   Procedures (including critical care time) DIAGNOSTIC STUDIES: Oxygen Saturation is 98% on RA, normal by my interpretation.    COORDINATION OF CARE: 5:47 PM Discussed treatment plan which includes acetaminophen level, CBC, comprehensive metabolic panel, ETOH level, salicylate lever, urine drug screen, pregnancy test and consult with ACT team with pt at bedside and pt agreed to plan.    Labs Reviewed  COMPREHENSIVE METABOLIC PANEL - Abnormal; Notable for the following:    Potassium 3.2 (*)    ALT 41 (*)    All other components within normal limits  SALICYLATE LEVEL - Abnormal; Notable for the following:    Salicylate Lvl <2.0 (*)    All other components within normal limits  ACETAMINOPHEN LEVEL  CBC  ETHANOL  URINE RAPID DRUG SCREEN (HOSP PERFORMED)  POCT PREGNANCY, URINE   No results found.  Diagnosis: 1. Depression 2. Bipolar disorder  MDM  She presents to the ER for evaluation of increased depression, thoughts of harming herself and harming others. Patient has history of bipolar disorder, has been in a manic phase for last 5 days. She admits to hearing voices. Her workup was negative, she was medically cleared for psychiatric treatment. Psychiatry consult recommends inpatient admission. Patient will be held, awaiting placement.  I personally performed the services described in this documentation, which was scribed in my presence. The recorded information has been reviewed and is accurate.    Gilda Crease, MD 06/03/13 2232

## 2013-06-03 NOTE — ED Notes (Signed)
MD at bedside. 

## 2013-06-03 NOTE — ED Notes (Signed)
Sleeping soundly, snoring

## 2013-06-03 NOTE — ED Notes (Signed)
Pt states she is si/hi, someone in particular. Pt lives in "halfway home". No plan to hurt self/others at this time. Cooperative. Denies hallucinations. States hearing voices that are arguing but doesn't tell her to hurt/kill self or others.

## 2013-06-04 ENCOUNTER — Inpatient Hospital Stay (HOSPITAL_COMMUNITY)
Admission: AD | Admit: 2013-06-04 | Discharge: 2013-06-08 | DRG: 885 | Disposition: A | Discharge status: Home / Self Care | Payer: Medicare Other | Source: Intra-hospital | Attending: Psychiatry | Admitting: Psychiatry

## 2013-06-04 ENCOUNTER — Encounter (HOSPITAL_COMMUNITY): Payer: Self-pay

## 2013-06-04 ENCOUNTER — Encounter (HOSPITAL_COMMUNITY): Payer: Self-pay | Admitting: *Deleted

## 2013-06-04 DIAGNOSIS — F314 Bipolar disorder, current episode depressed, severe, without psychotic features: Principal | ICD-10-CM

## 2013-06-04 DIAGNOSIS — F603 Borderline personality disorder: Secondary | ICD-10-CM | POA: Diagnosis present

## 2013-06-04 DIAGNOSIS — E039 Hypothyroidism, unspecified: Secondary | ICD-10-CM | POA: Diagnosis present

## 2013-06-04 DIAGNOSIS — R45850 Homicidal ideations: Secondary | ICD-10-CM

## 2013-06-04 DIAGNOSIS — F1020 Alcohol dependence, uncomplicated: Secondary | ICD-10-CM

## 2013-06-04 DIAGNOSIS — Z79899 Other long term (current) drug therapy: Secondary | ICD-10-CM

## 2013-06-04 DIAGNOSIS — F3160 Bipolar disorder, current episode mixed, unspecified: Secondary | ICD-10-CM

## 2013-06-04 HISTORY — DX: Major depressive disorder, single episode, unspecified: F32.9

## 2013-06-04 HISTORY — DX: Depression, unspecified: F32.A

## 2013-06-04 HISTORY — DX: Endometriosis, unspecified: N80.9

## 2013-06-04 HISTORY — DX: Hypothyroidism, unspecified: E03.9

## 2013-06-04 MED ORDER — TOPIRAMATE 100 MG PO TABS
200.0000 mg | ORAL_TABLET | Freq: Two times a day (BID) | ORAL | Status: DC
  Administered 2013-06-05 – 2013-06-08 (×8): 200 mg via ORAL
  Filled 2013-06-04 (×2): qty 2
  Filled 2013-06-04: qty 14
  Filled 2013-06-04 (×2): qty 2
  Filled 2013-06-04: qty 14
  Filled 2013-06-04 (×7): qty 2

## 2013-06-04 MED ORDER — MAGNESIUM HYDROXIDE 400 MG/5ML PO SUSP: 30.0000 mL | Freq: Every day | ORAL | Status: DC | PRN

## 2013-06-04 MED ORDER — HYDROXYZINE HCL 50 MG PO TABS
50.0000 mg | ORAL_TABLET | Freq: Two times a day (BID) | ORAL | Status: DC
  Administered 2013-06-05 (×2): 50 mg via ORAL
  Filled 2013-06-04 (×6): qty 1

## 2013-06-04 MED ORDER — IBUPROFEN 800 MG PO TABS
800.0000 mg | ORAL_TABLET | Freq: Four times a day (QID) | ORAL | Status: DC | PRN
  Administered 2013-06-04 – 2013-06-07 (×5): 800 mg via ORAL
  Filled 2013-06-04 (×5): qty 1

## 2013-06-04 MED ORDER — LEVOTHYROXINE SODIUM 112 MCG PO TABS
112.0000 ug | ORAL_TABLET | Freq: Every day | ORAL | Status: DC
  Administered 2013-06-05 – 2013-06-08 (×4): 112 ug via ORAL
  Filled 2013-06-04 (×7): qty 1

## 2013-06-04 MED ORDER — BUPROPION HCL ER (SR) 100 MG PO TB12
100.0000 mg | ORAL_TABLET | Freq: Two times a day (BID) | ORAL | Status: DC
  Administered 2013-06-05: 100 mg via ORAL
  Filled 2013-06-04 (×4): qty 1

## 2013-06-04 MED ORDER — ALUM & MAG HYDROXIDE-SIMETH 200-200-20 MG/5ML PO SUSP: 30.0000 mL | ORAL | Status: DC | PRN

## 2013-06-04 MED ORDER — HYDROXYZINE HCL 50 MG PO TABS
50.0000 mg | ORAL_TABLET | Freq: Every evening | ORAL | Status: DC | PRN
  Administered 2013-06-04: 50 mg via ORAL

## 2013-06-04 MED ORDER — ACETAMINOPHEN 325 MG PO TABS: 650.0000 mg | ORAL_TABLET | Freq: Four times a day (QID) | ORAL | Status: DC | PRN

## 2013-06-04 MED ORDER — NICOTINE 21 MG/24HR TD PT24
21.0000 mg | MEDICATED_PATCH | Freq: Once | TRANSDERMAL | Status: DC
  Administered 2013-06-04: 21 mg via TRANSDERMAL

## 2013-06-04 MED ORDER — IBUPROFEN 800 MG PO TABS
800.0000 mg | ORAL_TABLET | Freq: Once | ORAL | Status: AC
  Administered 2013-06-04: 800 mg via ORAL
  Filled 2013-06-04: qty 1

## 2013-06-04 MED ORDER — SERTRALINE HCL 100 MG PO TABS
100.0000 mg | ORAL_TABLET | Freq: Every day | ORAL | Status: DC
  Administered 2013-06-05: 100 mg via ORAL
  Filled 2013-06-04 (×3): qty 1

## 2013-06-04 MED ORDER — NICOTINE 21 MG/24HR TD PT24
21.0000 mg | MEDICATED_PATCH | Freq: Every day | TRANSDERMAL | Status: DC
  Administered 2013-06-05: 21 mg via TRANSDERMAL
  Filled 2013-06-04 (×2): qty 1
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
## 2013-06-04 NOTE — ED Notes (Signed)
Patient up to restroom, tolerated well. Reading news paper.

## 2013-06-04 NOTE — Tx Team (Signed)
Initial Interdisciplinary Treatment Plan  PATIENT STRENGTHS: (choose at least two) Ability for insight Active sense of humor Average or above average intelligence Communication skills General fund of knowledge Motivation for treatment/growth Physical Health Special hobby/interest  PATIENT STRESSORS: Medication change or noncompliance   PROBLEM LIST: Problem List/Patient Goals Date to be addressed Date deferred Reason deferred Estimated date of resolution  "Have better control of bipolar symptoms" 04 Jun 2013     "Coping skills to better manage temper" 04 Jun 2013     "Decreased feelings of hopelessness and worthlessness" 04 Jun 2013                                          DISCHARGE CRITERIA:  Ability to meet basic life and health needs Adequate post-discharge living arrangements Improved stabilization in mood, thinking, and/or behavior Medical problems require only outpatient monitoring  PRELIMINARY DISCHARGE PLAN: Outpatient therapy Return to previous living arrangement  PATIENT/FAMIILY INVOLVEMENT: This treatment plan has been presented to and reviewed with the patient, Kelsey Maldonado, and/or family member.  The patient and family have been given the opportunity to ask questions and make suggestions.  [name] 06/04/2013, 6:46 PM

## 2013-06-04 NOTE — ED Notes (Signed)
The patient states she is having lower abdominal cramps and would like to have Motrin for pain.

## 2013-06-04 NOTE — ED Notes (Signed)
Pt given meal tray.

## 2013-06-04 NOTE — ED Notes (Signed)
[facility] called to transport [facility] pt at this time Kelsey Maldonado

## 2013-06-04 NOTE — ED Provider Notes (Signed)
Pt accepted to [facility]  Kelsey Corona B. Bernette Mayers, MD 06/04/13 1416

## 2013-06-04 NOTE — Progress Notes (Signed)
Pt attended Karaoke group, was supportive of peers.  

## 2013-06-05 ENCOUNTER — Encounter (HOSPITAL_COMMUNITY): Payer: Self-pay | Admitting: Psychiatry

## 2013-06-05 DIAGNOSIS — F411 Generalized anxiety disorder: Secondary | ICD-10-CM

## 2013-06-05 DIAGNOSIS — F3130 Bipolar disorder, current episode depressed, mild or moderate severity, unspecified: Secondary | ICD-10-CM

## 2013-06-05 MED ORDER — RAMELTEON 8 MG PO TABS
8.0000 mg | ORAL_TABLET | Freq: Every day | ORAL | Status: DC
  Administered 2013-06-05 – 2013-06-07 (×3): 8 mg via ORAL
  Filled 2013-06-05 (×3): qty 1
  Filled 2013-06-05: qty 7
  Filled 2013-06-05: qty 1

## 2013-06-05 MED ORDER — NICOTINE 21 MG/24HR TD PT24
21.0000 mg | MEDICATED_PATCH | Freq: Every day | TRANSDERMAL | Status: DC
  Administered 2013-06-06 – 2013-06-08 (×3): 21 mg via TRANSDERMAL
  Filled 2013-06-05 (×6): qty 1

## 2013-06-05 MED ORDER — HYDROXYZINE HCL 50 MG PO TABS
50.0000 mg | ORAL_TABLET | Freq: Three times a day (TID) | ORAL | Status: DC
  Administered 2013-06-05 – 2013-06-08 (×10): 50 mg via ORAL
  Filled 2013-06-05: qty 1
  Filled 2013-06-05 (×2): qty 28
  Filled 2013-06-05: qty 1
  Filled 2013-06-05: qty 28
  Filled 2013-06-05 (×9): qty 1

## 2013-06-05 MED ORDER — RISPERIDONE 0.5 MG PO TABS
0.5000 mg | ORAL_TABLET | Freq: Two times a day (BID) | ORAL | Status: DC
  Administered 2013-06-05 – 2013-06-06 (×2): 0.5 mg via ORAL
  Filled 2013-06-05 (×6): qty 1

## 2013-06-05 MED ORDER — HYDROXYZINE HCL 50 MG PO TABS
50.0000 mg | ORAL_TABLET | Freq: Every day | ORAL | Status: DC
  Administered 2013-06-05 – 2013-06-07 (×3): 50 mg via ORAL
  Filled 2013-06-05 (×5): qty 1

## 2013-06-05 MED ORDER — FLUOXETINE HCL 20 MG PO CAPS
40.0000 mg | ORAL_CAPSULE | Freq: Every day | ORAL | Status: DC
  Administered 2013-06-05 – 2013-06-08 (×4): 40 mg via ORAL
  Filled 2013-06-05: qty 14
  Filled 2013-06-05 (×7): qty 2

## 2013-06-05 NOTE — [facility] Group Notes (Signed)
[facility] LCSW Group Therapy  06/05/2013  1:15 PM   Type of Therapy:  Group Therapy  Participation Level:  Did Not Attend  Kelsey Maldonado, LCSWA 06/05/2013 2:58 PM

## 2013-06-05 NOTE — Progress Notes (Signed)
Patient ID: Kelsey Maldonado, female   DOB: 1981/10/31, 31 y.o.   MRN: [id number] Patient asleep; no s/s of distress noted at this time. Pt's respirations regular and unlabored.

## 2013-06-05 NOTE — Tx Team (Signed)
Interdisciplinary Treatment Plan Update (Adult)  Date: 06/05/2013  Time Reviewed:  9:45 AM  Progress in Treatment: Attending groups: Yes Participating in groups:  Yes Taking medication as prescribed:  Yes Tolerating medication:  Yes Family/Significant othe contact made: CSW assessing  Patient understands diagnosis:  Yes Discussing patient identified problems/goals with staff:  Yes Medical problems stabilized or resolved:  Yes Denies suicidal/homicidal ideation: Yes Issues/concerns per patient self-inventory:  Yes Other:  New problem(s) identified: N/A  Discharge Plan or Barriers: CSW assessing for appropriate referrals.  Reason for Continuation of Hospitalization: Anxiety Depression Medication Stabilization  Comments: N/A  Estimated length of stay: 3-5 days  For review of initial/current patient goals, please see plan of care.  Attendees: Patient:     Family:     Physician:  Dr. Johnalagadda 06/05/2013 11:02 AM   Nursing:   Sheila Lilly, RN 06/05/2013 11:02 AM   Clinical Social Worker:  Phynix Horton Horton, LCSWA 06/05/2013 11:02 AM   Other: Cynthia Goble, RN 06/05/2013 11:02 AM   Other:  Maseta Dorley, MA care coordination 06/05/2013 11:02 AM   Other:  Jennifer Clark, case manager 06/05/2013 11:02 AM   Other:  Roneica Byrd, RN 06/05/2013 11:02 AM  Other:    Other:    Other:    Other:    Other:    Other:     Scribe for Treatment Team:   Horton, Tasheka Houseman Nicole, 06/05/2013 11:02 AM    

## 2013-06-05 NOTE — Progress Notes (Signed)
Nutrition Note  Patient seen due to Malnutrition Screen Score.  Patient states that her usual weight is 173 lbs and is very upset about her weight.  Asking for a weight loss pill.  Body mass index is 31 kg/(m^2). Pt meets criteria for obesity grade 1 based on current BMI.  RD provided "Weight Loss Tips" handout from the Academy of [employer] Dietetics. Emphasized the importance of serving sizes and provided examples of correct portions of common foods. Discussed importance of controlled and consistent intake throughout the day. Provided examples of ways to balance meals/snacks and encouraged intake of high-fiber, whole grain complex carbohydrates. Emphasized the importance of hydration with calorie-free beverages and limiting sugar-sweetened beverages. Encouraged pt to discuss physical activity options with physician. Teach back method used.  Expect fair compliance.  Current diet order is regular, patient is consuming approximately 100% of meals at this time. Labs and medications reviewed. No further nutrition interventions warranted at this time. RD contact information provided. If additional nutrition issues arise, please re-consult RD.  Oran Rein, RD, LDN Clinical Inpatient Dietitian Pager:  [phone] Weekend and after hours pager:  [phone]
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
## 2013-06-05 NOTE — Progress Notes (Signed)
Adult Psychoeducational Group Note  Date:  06/05/2013 Time:  6:35 PM  Group Topic/Focus:  Relapse Prevention Planning:   The focus of this group is to define relapse and discuss the need for planning to combat relapse.  Participation Level:  Active  Participation Quality:  Appropriate, Sharing and Supportive  Affect:  Appropriate  Cognitive:  Appropriate  Insight: Appropriate  Engagement in Group:  Engaged  Modes of Intervention:  Discussion  Additional Comments:  Pt identified triggers and goals for discharge. Pt offered support and words of wisdom to peers. Pt has a positive outlook.    Tora Perches N 06/05/2013, 6:35 PM
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
## 2013-06-05 NOTE — Progress Notes (Signed)
Patient ID: Kelsey Maldonado, female   DOB: August 14, 1981, 32 y.o.   MRN: [id number]  D: Patient pleasant and cooperative with assessment; pt attending group session. Pt denies SI at this time. A: Q 15 minute safety checks, encourage staff/peer interaction and group attendance. Administer medications as ordered by MD. R: Pt contracts for safety, no inappropriate behaviors noted.

## 2013-06-05 NOTE — [facility] Group Notes (Signed)
[facility] LCSW Aftercare Discharge Planning Group Note   06/05/2013  8:45 AM  Participation Quality:  Alert and Appropriate   Mood/Affect:  Appropriate  Depression Rating:  2  Anxiety Rating:  "12"  Thoughts of Suicide:  Pt denies SI/HI  Will you contract for safety?   Yes  Current AVH:  Pt denies  Plan for Discharge/Comments:  Pt attended discharge planning group and actively participated in group.  CSW provided pt with today's workbook.  Pt states that she lives in [place] and will return home there.  Pt will follow up at [facility] for medication management and therapy.  No further needs voiced by pt at this time.    Transportation Means: Pt reports access to transportation  Supports: No supports mentioned at this time  Kelsey Maldonado, LCSWA 06/05/2013 9:42 AM

## 2013-06-05 NOTE — Progress Notes (Signed)
D:  Kelsey Maldonado reports that she did not sleep well and her appetite is poor.  She is stating that she feels hyper and she has a lot of energy.  Her depression is "all over the place".  She denies SI/HI/AVH at this time, but states that she feels "angry", but not at any particular person.  She reported an episode of anxiety and was given Vistaril prn.  She is attending groups. A:  Medications administered as ordered.  Safety checks q 15 minutes.  Emotional support provided. R:  Safety maintained on unit.

## 2013-06-06 MED ORDER — RISPERIDONE 1 MG PO TABS
1.0000 mg | ORAL_TABLET | Freq: Two times a day (BID) | ORAL | Status: DC
Start: 2013-06-07 — End: 2013-06-07
  Administered 2013-06-07: 1 mg via ORAL
  Filled 2013-06-06 (×5): qty 1

## 2013-06-06 NOTE — Progress Notes (Signed)
Adult Psychoeducational Group Note  Date:  06/06/2013 Time:  2:29 PM  Group Topic/Focus:  Healthy Communication:   The focus of this group is to discuss communication, barriers to communication, as well as healthy ways to communicate with others.  Participation Level:  Did Not Attend   Elijio Miles 06/06/2013, 2:29 PM

## 2013-06-06 NOTE — Progress Notes (Signed)
Adult Psychoeducational Group Note  Date:  06/06/2013 Time:  800 pm  Group Topic/Focus:  Wrap-Up Group:   The focus of this group is to help patients review their daily goal of treatment and discuss progress on daily workbooks.  Participation Level:  Active  Participation Quality:  Appropriate  Affect:  Appropriate  Cognitive:  Appropriate  Insight: Appropriate  Engagement in Group:  Engaged  Modes of Intervention:  Discussion  Additional Comments:  Pt expressed that she has learned that keeping her mouth shut and not speaking what is exactly is on her mind is helpful.  Pt reported that she felt disrespected towards staff and this was a scenario where se did not speak her mind although she typically does. Pt expressed that she was able to speak to other staff to get the situation resolved and/or addressed.  Marvis Moeller A 06/06/2013, 11:57 PM

## 2013-06-06 NOTE — Progress Notes (Signed)
Psychoeducational Group Note  Date: 06/06/2013 Time:  1015  Group Topic/Focus:  Identifying Needs:   The focus of this group is to help patients identify their personal needs that have been historically problematic and identify healthy behaviors to address their needs.  Participation Level:  Active  Participation Quality:  Appropriate  Affect:  Appropriate  Cognitive:  Appropriate  Insight:  Improving  Engagement in Group:  Engaged  Additional Comments:    Kelsey Maldonado A 

## 2013-06-06 NOTE — Progress Notes (Signed)
 .  Psychoeducational Group Note    Date: 06/06/2013 Time: 0930   Goal Setting Purpose of Group: To be able to set a goal that is measurable and that can be accomplished in one day Participation Level:  Active  Participation Quality:  Appropriate and Attentive  Affect:  Appropriate  Cognitive:  Appropriate  Insight:  Improving  Engagement in Group:  Improving  Additional Comments:    Elane Peabody A 

## 2013-06-06 NOTE — Progress Notes (Addendum)
Patient ID: Kelsey Maldonado, female   DOB: 05/31/1981, 32 y.o.   MRN: [id number] D)  Pt had been sleeping, woke up and came out, said she didn't take her hs meds and wanted them.  Was slightly confused, wanted and was given a snack, went back to bed after reassured she had been given her hs meds.  No other c/o's voiced.  A) will continue to monitor for safety. R)  Safety maintained.
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
## 2013-06-06 NOTE — Progress Notes (Signed)
[facility] MD Progress Note  06/06/2013 5:27 PM Kelsey Maldonado  MRN:  [id number] Subjective:  Patient has been diagnosed with bipolar disorder, most recent episode is a depression and patient has been suicidal and homicidal ideation towards another resident in a group home. Patient has been suffering with mood swings irritability has station and anger about. Reportedly patient medication was the recently modified by family psychiatrist which is partly to blame her uncontrollable mood swings. Patient has been compliant with her medication without adverse effects. Patient has been participating in inpatient hospitalization program without much difficulty. Patient has no reported behavior problems and working with the staff members and peers.  Diagnosis:  Axis I: Bipolar, Depressed  ADL's:  Intact  Sleep: Fair  Appetite:  Fair  Suicidal Ideation:  Patient has endorsed suicidal ideation but no plans and intentions  Homicidal Ideation:  Patient has been irritable, angry towards the other people but not directly homicidal towards them  AEB (as evidenced by):  Psychiatric Specialty Exam: ROS  Blood pressure 105/74, pulse 94, temperature 97.6 F (36.4 C), temperature source Oral, resp. rate 18, height 5\' 7"  (1.702 m), weight 89.812 kg (198 lb), last menstrual period 06/02/2013.Body mass index is 31 kg/(m^2).  General Appearance: Fairly Groomed  Patent attorney::  Fair  Speech:  Clear and Coherent  Volume:  Increased  Mood:  Angry, Depressed and Irritable  Affect:  Depressed and Labile  Thought Process:  Goal Directed and Linear  Orientation:  Full (Time, Place, and Person)  Thought Content:  Paranoid Ideation and Rumination  Suicidal Thoughts:  Yes.  without intent/plan  Homicidal Thoughts:  Yes.  without intent/plan  Memory:  Immediate;   Fair  Judgement:  Impaired  Insight:  Lacking  Psychomotor Activity:  Psychomotor Retardation  Concentration:  Fair  Recall:  Fair  Akathisia:  NA  Handed:   Right  AIMS (if indicated):     Assets:  Communication Skills Desire for Improvement Physical Health Resilience  Sleep:  Number of Hours: 6.25   Current Medications: Current Facility-Administered Medications  Medication Dose Route Frequency Provider Last Rate Last Dose  . acetaminophen (TYLENOL) tablet 650 mg  650 mg Oral Q6H PRN Nelly Rout, MD      . alum & mag hydroxide-simeth (MAALOX/MYLANTA) 200-200-20 MG/5ML suspension 30 mL  30 mL Oral Q4H PRN Nelly Rout, MD      . FLUoxetine (PROZAC) capsule 40 mg  40 mg Oral Daily Nehemiah Settle, MD   40 mg at 06/06/13 3086  . hydrOXYzine (ATARAX/VISTARIL) tablet 50 mg  50 mg Oral TID Nanine Means, NP   50 mg at 06/06/13 1159  . hydrOXYzine (ATARAX/VISTARIL) tablet 50 mg  50 mg Oral QHS Nanine Means, NP   50 mg at 06/05/13 2115  . ibuprofen (ADVIL,MOTRIN) tablet 800 mg  800 mg Oral Q6H PRN Sanjuana Kava, NP   800 mg at 06/06/13 5784  . levothyroxine (SYNTHROID, LEVOTHROID) tablet 112 mcg  112 mcg Oral QAC breakfast Nelly Rout, MD   112 mcg at 06/06/13 [phone]  . magnesium hydroxide (MILK OF MAGNESIA) suspension 30 mL  30 mL Oral Daily PRN Nelly Rout, MD      . nicotine (NICODERM CQ - dosed in mg/24 hours) patch 21 mg  21 mg Transdermal Daily Nehemiah Settle, MD   21 mg at 06/06/13 0647  . ramelteon (ROZEREM) tablet 8 mg  8 mg Oral QHS Nanine Means, NP   8 mg at 06/05/13 2115  . risperiDONE (RISPERDAL) tablet  0.5 mg  0.5 mg Oral BID Nanine Means, NP   0.5 mg at 06/06/13 8295  . topiramate (TOPAMAX) tablet 200 mg  200 mg Oral BID Nelly Rout, MD   200 mg at 06/06/13 6213    Lab Results: No results found for this or any previous visit (from the past 48 hour(s)).  Physical Findings: AIMS: Facial and Oral Movements Muscles of Facial Expression: None, normal Lips and Perioral Area: None, normal Jaw: None, normal Tongue: None, normal,Extremity Movements Upper (arms, wrists, hands, fingers): None, normal Lower (legs,  knees, ankles, toes): None, normal, Trunk Movements Neck, shoulders, hips: None, normal, Overall Severity Severity of abnormal movements (highest score from questions above): None, normal Incapacitation due to abnormal movements: None, normal Patient's awareness of abnormal movements (rate only patient's report): No Awareness, Dental Status Current problems with teeth and/or dentures?: No Does patient usually wear dentures?: No  CIWA:    COWS:     Treatment Plan Summary: Daily contact with patient to assess and evaluate symptoms and progress in treatment Medication management  Plan: Increase Risperdal to 1 mg twice daily for mood swings and panic symptoms 1. Continue for crisis management and stabilization. 2. Medication management to reduce current symptoms to base line and improve the patient's overall level of functioning. 3. Treat health problems as indicated. 4. Develop treatment plan to decrease risk of relapse upon discharge and to reduce the need for readmission. 5. Psycho-social education regarding relapse prevention and self care. 6. Health care follow up as needed for medical problems. 7. Restart home medications where appropriate.   Medical Decision Making Problem Points:  Established problem, worsening (2), Review of last therapy session (1) and Review of psycho-social stressors (1) Data Points:  Review or order clinical lab tests (1) Review or order medicine tests (1) Review of medication regiment & side effects (2) Review of new medications or change in dosage (2)  I certify that inpatient services furnished can reasonably be expected to improve the patient's condition.   Nehemiah Settle., M.D.  06/06/2013, 5:27 PM

## 2013-06-06 NOTE — Progress Notes (Signed)
D) Pt states that she is feeling anxious. Worried about housing after she leaves here even though she knows that she can return to the half way house she was living in. States that she worries. A) Provided with a 1:1. Given support and provided with positive feedback. Given encouragement. R) Denies SI and HI. Working on her issues and open to new ways of doing things.

## 2013-06-06 NOTE — B[facility] Group Notes (Signed)
[facility] Group Notes:  (Clinical Social Work)  06/06/2013   3:00-4:00PM  Summary of Progress/Problems:   The main focus of today's process group was for the patient to identify ways in which they have sabotaged their own mental health wellness/recovery.  Motivational interviewing was used to explore the reasons they engage in this behavior, and reasons they may have for wanting to change.  The Stages of Change were explained to the group using a handout, and patients identified where they are with regard to changing self-defeating behaviors.  The patient came into group late.  She expressed that she is in Preparation Stage of change.  Type of Therapy:  Process Group  Participation Level:  Active  Participation Quality:  Attentive  Affect:  Blunted  Cognitive:  Appropriate  Insight:  Developing/Improving  Engagement in Therapy:  Improving  Modes of Intervention:  Education, Motivational Interviewing   Ambrose Mantle, LCSW 06/06/2013, 5:43 PM

## 2013-06-07 MED ORDER — LAMOTRIGINE 100 MG PO TABS
50.0000 mg | ORAL_TABLET | Freq: Two times a day (BID) | ORAL | Status: DC
  Administered 2013-06-07 – 2013-06-08 (×3): 50 mg via ORAL
  Filled 2013-06-07 (×3): qty 2
  Filled 2013-06-07: qty 7
  Filled 2013-06-07: qty 2
  Filled 2013-06-07: qty 7

## 2013-06-07 NOTE — Progress Notes (Signed)
D) Pt has been very sleepy today. Left group because she is having trouble staying awake.  Pt is just too tired to attend the groups. Pt rates her depression and hopelessness both at a 1 and denies SI and HI.  States overall that she is feeling better. A) Given support and encouragement. Encouraged to go to the groups and to sleep in between them. R) Denies SI and HI.

## 2013-06-07 NOTE — Progress Notes (Signed)
Patient requested ibuprofen for abdominal pain she rated an 8 and received 2 heat packs. She is planning on attending group this evening. Patient currently denies si/hi/a/v hall. Patient reports possibly discharging on tomorrow and feels that she is ready. Support and encouragement offered, safety maintained on unit, will continue to monitor.

## 2013-06-07 NOTE — Progress Notes (Signed)
Patient ID: Kelsey Maldonado, female   DOB: 12-23-1980, 32 y.o.   MRN: [id number] [facility] MD Progress Note  06/07/2013 1:49 PM Kelsey Maldonado  MRN:  [id number]  Subjective:  Patient complaining about increased appetite and possibly gaining weight because of Risperdal. Patient requested to change her medication to another medication which does not cause too much weight gain. Patient has been suffering with mood swings, irritability and anger outbursts. She has no irritability, agitation and aggressive on the unit. Patient has been participating in inpatient hospitalization program without much difficulty.   Diagnosis:  Axis I: Bipolar, Depressed  ADL's:  Intact  Sleep: Fair  Appetite:  Fair  Suicidal Ideation:  Patient has endorsed suicidal ideation but no plans and intentions  Homicidal Ideation:  Patient has been irritable, angry towards the other people but not directly homicidal towards them  AEB (as evidenced by):  Psychiatric Specialty Exam: ROS  Blood pressure 84/53, pulse 85, temperature 97.6 F (36.4 C), temperature source Oral, resp. rate 16, height 5\' 7"  (1.702 m), weight 89.812 kg (198 lb), last menstrual period 06/02/2013.Body mass index is 31 kg/(m^2).  General Appearance: Fairly Groomed  Patent attorney::  Fair  Speech:  Clear and Coherent  Volume:  Increased  Mood:  Angry, Depressed and Irritable  Affect:  Depressed and Labile  Thought Process:  Goal Directed and Linear  Orientation:  Full (Time, Place, and Person)  Thought Content:  Paranoid Ideation and Rumination  Suicidal Thoughts:  Yes.  without intent/plan  Homicidal Thoughts:  Yes.  without intent/plan  Memory:  Immediate;   Fair  Judgement:  Impaired  Insight:  Lacking  Psychomotor Activity:  Psychomotor Retardation  Concentration:  Fair  Recall:  Fair  Akathisia:  NA  Handed:  Right  AIMS (if indicated):     Assets:  Communication Skills Desire for Improvement Physical Health Resilience  Sleep:  Number of Hours:  6.75   Current Medications: Current Facility-Administered Medications  Medication Dose Route Frequency Provider Last Rate Last Dose  . acetaminophen (TYLENOL) tablet 650 mg  650 mg Oral Q6H PRN Nelly Rout, MD      . alum & mag hydroxide-simeth (MAALOX/MYLANTA) 200-200-20 MG/5ML suspension 30 mL  30 mL Oral Q4H PRN Nelly Rout, MD      . FLUoxetine (PROZAC) capsule 40 mg  40 mg Oral Daily Nehemiah Settle, MD   40 mg at 06/07/13 0830  . hydrOXYzine (ATARAX/VISTARIL) tablet 50 mg  50 mg Oral TID Nanine Means, NP   50 mg at 06/07/13 1203  . hydrOXYzine (ATARAX/VISTARIL) tablet 50 mg  50 mg Oral QHS Nanine Means, NP   50 mg at 06/06/13 2052  . ibuprofen (ADVIL,MOTRIN) tablet 800 mg  800 mg Oral Q6H PRN Sanjuana Kava, NP   800 mg at 06/06/13 1942  . lamoTRIgine (LAMICTAL) tablet 50 mg  50 mg Oral BID Nehemiah Settle, MD      . levothyroxine (SYNTHROID, LEVOTHROID) tablet 112 mcg  112 mcg Oral QAC breakfast Nelly Rout, MD   112 mcg at 06/07/13 [phone]  . magnesium hydroxide (MILK OF MAGNESIA) suspension 30 mL  30 mL Oral Daily PRN Nelly Rout, MD      . nicotine (NICODERM CQ - dosed in mg/24 hours) patch 21 mg  21 mg Transdermal Daily Nehemiah Settle, MD   21 mg at 06/07/13 0649  . ramelteon (ROZEREM) tablet 8 mg  8 mg Oral QHS Nanine Means, NP   8 mg at 06/06/13 2052  .  topiramate (TOPAMAX) tablet 200 mg  200 mg Oral BID Nelly Rout, MD   200 mg at 06/07/13 0830    Lab Results: No results found for this or any previous visit (from the past 48 hour(s)).  Physical Findings: AIMS: Facial and Oral Movements Muscles of Facial Expression: None, normal Lips and Perioral Area: None, normal Jaw: None, normal Tongue: None, normal,Extremity Movements Upper (arms, wrists, hands, fingers): None, normal Lower (legs, knees, ankles, toes): None, normal, Trunk Movements Neck, shoulders, hips: None, normal, Overall Severity Severity of abnormal movements (highest score  from questions above): None, normal Incapacitation due to abnormal movements: None, normal Patient's awareness of abnormal movements (rate only patient's report): No Awareness, Dental Status Current problems with teeth and/or dentures?: No Does patient usually wear dentures?: No  CIWA:    COWS:     Treatment Plan Summary: Daily contact with patient to assess and evaluate symptoms and progress in treatment Medication management  Plan: Discontinue Risperdal to 1 mg twice daily for increased appetite and possibly gaining weight  Start Lamictal 50 mg twice daily for mood swings and panic symptoms 1. Continue for crisis management and stabilization. 2. Medication management to reduce current symptoms to base line and improve the patient's overall level of functioning. 3. Treat health problems as indicated. 4. Develop treatment plan to decrease risk of relapse upon discharge and to reduce the need for readmission. 5. Psycho-social education regarding relapse prevention and self care. 6. Health care follow up as needed for medical problems. 7. Restart home medications where appropriate.   Medical Decision Making Problem Points:  Established problem, worsening (2), Review of last therapy session (1) and Review of psycho-social stressors (1) Data Points:  Review or order clinical lab tests (1) Review or order medicine tests (1) Review of medication regiment & side effects (2) Review of new medications or change in dosage (2)  I certify that inpatient services furnished can reasonably be expected to improve the patient's condition.   Nehemiah Settle., M.D.  06/07/2013, 1:49 PM

## 2013-06-07 NOTE — Progress Notes (Signed)
Psychoeducational Group Note  Date:  06/07/2013 Time:  1015  Group Topic/Focus:  Making Healthy Choices:   The focus of this group is to help patients identify negative/unhealthy choices they were using prior to admission and identify positive/healthier coping strategies to replace them upon discharge.  Participation Level:  Active  Participation Quality:  Appropriate  Affect:  Appropriate  Cognitive:  Appropriate  Insight:  Improving  Engagement in Group:  Engaged  Additional Comments:    Modesto Ganoe A 06/07/2013 

## 2013-06-07 NOTE — Progress Notes (Signed)
[facility] Group Notes:  (Nursing/MHT/Case Management/Adjunct)  Date:  06/07/2013  Time:  2000  Type of Therapy:  Psychoeducational Skills  Participation Level:  Active  Participation Quality:  Appropriate  Affect:  Appropriate  Cognitive:  Appropriate  Insight:  Lacking  Engagement in Group:  Developing/Improving  Modes of Intervention:  Education  Summary of Progress/Problems: The patient verbalized in group that she had a "wonderful" day. She attributed this to sleeping a great deal during the daytime. Her goal for tomorrow is to speak with the doctor and case manager about discharge planning.   Hazle Coca S 06/07/2013, 11:36 PM

## 2013-06-07 NOTE — [facility] Group Notes (Signed)
[facility] Group Notes: (Clinical Social Work)   06/07/2013      Type of Therapy:  Group Therapy   Participation Level:  Did Not Attend    Ambrose Mantle, LCSW 06/07/2013, 5:16 PM

## 2013-06-07 NOTE — Progress Notes (Signed)
Psychoeducational Group Note  Psychoeducational Group Note  Date: 06/07/2013 Time:  0930  Group Topic/Focus:  Gratefulness:  The focus of this group is to help patients identify what two things they are most grateful for in their lives. What helps ground them and to center them on their work to their recovery.  Participation Level:  Did not attend   Dione Housekeeper

## 2013-06-08 MED ORDER — RAMELTEON 8 MG PO TABS: 8.0000 mg | ORAL_TABLET | Freq: Every day | ORAL | Status: DC

## 2013-06-08 MED ORDER — TOPIRAMATE 200 MG PO TABS: 200.0000 mg | ORAL_TABLET | Freq: Two times a day (BID) | ORAL | Status: DC

## 2013-06-08 MED ORDER — FLUOXETINE HCL 40 MG PO CAPS: 40.0000 mg | ORAL_CAPSULE | Freq: Every day | ORAL | Status: DC

## 2013-06-08 MED ORDER — HYDROXYZINE HCL 50 MG PO TABS: ORAL_TABLET | ORAL | Status: DC

## 2013-06-08 MED ORDER — LAMOTRIGINE 25 MG PO TABS: 50.0000 mg | ORAL_TABLET | Freq: Two times a day (BID) | ORAL | Status: DC

## 2013-06-08 NOTE — Progress Notes (Signed)
Patient ID: Kelsey Maldonado, female   DOB: [birth year], 32 y.o.   MRN: [id number] Nursing d/c note- Patient is cooperative during d/c process.  Reviewed all medications and f/u appointments. Denies SI and states all treatment goals were met. All belongings returnes and escorted to lobby to care of family.

## 2013-06-08 NOTE — Progress Notes (Signed)
Recreation Therapy Notes  Date: 08.04.2014 Time: 2:15pm Location: [address] Dayroom  Group Topic: Problem Solving  Goal Area(s) Addresses:  Patient will effectively work in a team with other group members. Patient will verbalize skills needed to make activity successful.  Patient will verbalize ways to use skills used in group session post d/c.  Behavioral Response: Did not attend.    Torey L Aisling Emigh, LRT/CTRS   Timoteo Carreiro, Temperence L 06/08/2013 10:24 PM 

## 2013-06-08 NOTE — [facility] Group Notes (Signed)
[facility] LCSW Group Therapy  06/08/2013  1:15 PM   Type of Therapy:  Group Therapy  Participation Level:  Active  Participation Quality:  Appropriate and Attentive  Affect:  Appropriate and Bright  Cognitive:  Alert and Appropriate  Insight:  Developing/Improving and Engaged  Engagement in Therapy:  Developing/Improving and Engaged  Modes of Intervention:  Clarification, Confrontation, Discussion, Education, Exploration, Limit-setting, Orientation, Problem-solving, Rapport Building, Dance movement psychotherapist, Socialization and Support  Summary of Progress/Problems: Pt identified obstacles faced currently and processed barriers involved in overcoming these obstacles. Pt identified steps necessary for overcoming these obstacles and explored motivation (internal and external) for facing these difficulties head on. Pt further identified one area of concern in their lives and chose a goal to focus on for today. Pt was able to relate on the obstacle of others understand mental illness and feeling stigmatized due to her mental illness.  Pt actively participated and was engaged in group discussion.    Reyes Ivan, LCSWA 06/08/2013 2:21 PM
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
## 2013-06-08 NOTE — Progress Notes (Signed)
[facility] Adult Case Management Discharge Plan :  Will you be returning to the same living situation after discharge: Yes,  returning to [facility] At discharge, do you have transportation home?:Yes,  mother will pick pt up Do you have the ability to pay for your medications:Yes,  access to meds  Release of information consent forms completed and in the chart;  Patient's signature needed at discharge.  Patient to Follow up at: Follow-up Information   Follow up with Arna Medici On 06/09/2013. (Walk in on this date for hospital discharge appointment.  Walk in clinic is 7:45 - 11 am.)    Contact information:   Physical Address: [address] [place], [place] [address]  Mailing Address:  [address] [place], [place] [address] Phone: [phone] Fax: [phone]      Patient denies SI/HI:   Yes,  denies SI/HI    Safety Planning and Suicide Prevention discussed:  Yes,  discussed with pt.  Unable to reach pt's mother.  See suicide prevention education note.   Carmina Miller 06/08/2013, 11:50 AM

## 2013-06-08 NOTE — Tx Team (Signed)
Interdisciplinary Treatment Plan Update (Adult)  Date: 06/08/2013  Time Reviewed:  9:45 AM  Progress in Treatment: Attending groups: Yes Participating in groups:  Yes Taking medication as prescribed:  Yes Tolerating medication:  Yes Family/Significant othe contact made: No, attempts made Patient understands diagnosis:  Yes Discussing patient identified problems/goals with staff:  Yes Medical problems stabilized or resolved:  Yes Denies suicidal/homicidal ideation: Yes Issues/concerns per patient self-inventory:  Yes Other:  New problem(s) identified: N/A  Discharge Plan or Barriers: Pt will follow up at [facility] for medication management and therapy  Reason for Continuation of Hospitalization: Stable to d/c  Comments: N/A  Estimated length of stay: D/C today  For review of initial/current patient goals, please see plan of care.  Attendees: Patient:  Kelsey Maldonado  06/08/2013 11:08 AM  Family:     Physician:  Dr. Javier Glazier 06/08/2013 11:08 AM   Nursing:   Burnetta Sabin, RN 06/08/2013 11:08 AM   Clinical Social Worker:  Reyes Ivan, LCSWA 06/08/2013 11:08 AM   Other: Verne Spurr, PA  06/08/2013 11:08 AM   Other:  Frankey Shown, MA care coordination 06/08/2013 11:08 AM   Other:  Quintella Reichert, RN 06/08/2013 11:08 AM   Other:  Neill Loft, RN 06/08/2013 11:08 AM  Other:    Other:    Other:    Other:    Other:    Other:     Scribe for Treatment Team:   Carmina Miller, 06/08/2013 11:08 AM

## 2013-06-08 NOTE — Progress Notes (Signed)
Adult Psychoeducational Group Note  Date:  06/08/2013 Time:  11:59 AM  Group Topic/Focus:  Wellness Toolbox:   The focus of this group is to discuss various aspects of wellness, balancing those aspects and exploring ways to increase the ability to experience wellness.  Patients will create a wellness toolbox for use upon discharge.  Participation Level:  Did Not Attend  Participation Quality:    Affect:    Cognitive:    Insight:   Engagement in Group:    Modes of Intervention:    Additional Comments:  Pt did not attend group.   Lorin Mercy 06/08/2013, 11:59 AM

## 2013-06-08 NOTE — [facility] Suicide Risk Assessment (Signed)
[facility] INPATIENT:  Family/Significant Other Suicide Prevention Education  Suicide Prevention Education:  Contact Attempts: Janett Labella - mother [phone]), (name of family member/significant other) has been identified by the patient as the family member/significant other with whom the patient will be residing, and identified as the person(s) who will aid the patient in the event of a mental health crisis.  With written consent from the patient, two attempts were made to provide suicide prevention education, prior to and/or following the patient's discharge.  We were unsuccessful in providing suicide prevention education.  A suicide education pamphlet was given to the patient to share with family/significant other.  Date and time of first attempt: 06/08/13 @ 10:00 am Date and time of second attempt: 06/08/13 @ 11:30 am  Horton, Salome Arnt 06/08/2013, 11:47 AM

## 2013-06-08 NOTE — [facility] Group Notes (Signed)
[facility] LCSW Aftercare Discharge Planning Group Note   06/08/2013 8:45 AM  Participation Quality:  Alert and Appropriate   Mood/Affect:  Appropriate and Bright  Depression Rating:  0  Anxiety Rating:  0  Thoughts of Suicide:  Pt denies SI/HI  Will you contract for safety?   Yes  Current AVH:  Pt denies  Plan for Discharge/Comments:  Pt attended discharge planning group and actively participated in group.  CSW provided pt with today's workbook.  Pt reports havnig high energy today and feeling ready to d/c today.  Pt states that she will return to [facility] in [place].  Pt will follow up with Arna Medici for medication management and therapy.  No further needs voiced by pt at this time.    Transportation Means: Pt reports access to transportation  Supports: No supports mentioned at this time  Reyes Ivan, LCSWA 06/08/2013 10:27 AM

## 2013-06-08 NOTE — Discharge Summary (Signed)
Physician Discharge Summary Note  Patient:  Kelsey Maldonado is an 32 y.o., female MRN:  [id number] DOB:  04/26/1981 Patient phone:  [phone] (home)  Patient address:   [address] [place] [address],   Date of Admission:  06/04/2013 Date of Discharge: 06/08/2013   Reason for Admission: Bipolar disorder  Discharge Diagnoses: Principal Problem:   Bipolar I disorder, most recent episode (or current) depressed, severe, without mention of psychotic behavior  Review of Systems  Constitutional: Negative.  Negative for fever, chills, weight loss, malaise/fatigue and diaphoresis.  HENT: Negative for congestion and sore throat.   Eyes: Negative for blurred vision, double vision and photophobia.  Respiratory: Negative for cough, shortness of breath and wheezing.   Cardiovascular: Negative for chest pain, palpitations and PND.  Gastrointestinal: Negative for heartburn, nausea, vomiting, abdominal pain, diarrhea and constipation.  Musculoskeletal: Negative for myalgias, joint pain and falls.  Neurological: Negative for dizziness, tingling, tremors, sensory change, speech change, focal weakness, seizures, loss of consciousness, weakness and headaches.  Endo/Heme/Allergies: Negative for polydipsia. Does not bruise/bleed easily.  Psychiatric/Behavioral: Negative for depression, suicidal ideas, hallucinations, memory loss and substance abuse. The patient is not nervous/anxious and does not have insomnia.    Discharge Diagnoses:  AXIS I: Bipolar, Depressed  AXIS II: Cluster B Traits  AXIS III:  Past Medical History   Diagnosis  Date   .  Thyroid disease    .  Bipolar 1 disorder    .  Hypothyroidism    .  Depression    .  Endometriosis     AXIS IV: economic problems, occupational problems, other psychosocial or environmental problems, problems related to social environment and problems with primary support group  AXIS V: 51-60 moderate symptoms   Level of Care:  OP  Hospital Course:          Kelsey Maldonado was admitted voluntarily after presenting to the ED reporting increased suicidal ideations with homicidal ideations toward other women with whom she was living in the halfway house.  She was 90 days sober in this facility when she began to endorse increasing depression with auditory hallucinations. Kelsey Maldonado was evaluated in the ED and accepted for transfer to [facility] for further stabilization and crisis management.       Kelsey Maldonado was admitted to the unit and evaluated. The symptoms were identified as increasingly depressed mood, fatigue, hopelessness and helplessness, recurrent thoughts of death, disturbed sleep, poor appetite, elevated mood, impulsivity, irritable mood, lability of mood, with excessive worry. She also noted auditory hallucinations with no visual hallucinations or command voices.        The patient was oriented to the unit. She was encouraged to participate in unit programming and medication management was initiated.        Kelsey Maldonado was evaluated by a clinical provider to ascertain the patient's response to treatment.  Improvement was noted by the her report of decreasing symptoms, improved sleep and appetite, affect, medication tolerance, behavior, and participation in unit programming.  The patient was asked each day to complete a self inventory noting mood, mental status, pain, new symptoms, anxiety and concerns.        The patient responded well to medication and being in a therapeutic and supportive environment. Positive and appropriate behavior was noted and the patient was motivated for recovery. The patient worked closely with the treatment team and case manager to develop a discharge plan with appropriate goals. Coping skills, problem solving as well as relaxation therapies were also part of the  unit programming.         By the day of discharge the patient was in much improved condition than upon admission.  Symptoms were reported as significantly decreased or resolved  completely. The patient denied SI/HI and voiced no AVH. She was motivated to continue taking medication with a goal of continued improvement in mental health. She was discharged home with a plan to follow up as noted below.  Consults:  None  Significant Diagnostic Studies:  None  Discharge Vitals:   Blood pressure 95/64, pulse 62, temperature 98.2 F (36.8 C), temperature source Oral, resp. rate 16, height 5\' 7"  (1.702 m), weight 89.812 kg (198 lb), last menstrual period 06/02/2013. Body mass index is 31 kg/(m^2). Lab Results:   No results found for this or any previous visit (from the past 72 hour(s)).  Physical Findings: AIMS: Facial and Oral Movements Muscles of Facial Expression: None, normal Lips and Perioral Area: None, normal Jaw: None, normal Tongue: None, normal,Extremity Movements Upper (arms, wrists, hands, fingers): None, normal Lower (legs, knees, ankles, toes): None, normal, Trunk Movements Neck, shoulders, hips: None, normal, Overall Severity Severity of abnormal movements (highest score from questions above): None, normal Incapacitation due to abnormal movements: None, normal Patient's awareness of abnormal movements (rate only patient's report): No Awareness, Dental Status Current problems with teeth and/or dentures?: No Does patient usually wear dentures?: No  CIWA:    COWS:     Psychiatric Specialty Exam: See Psychiatric Specialty Exam and Suicide Risk Assessment completed by Attending Physician prior to discharge.  Discharge destination:  Home  Is patient on multiple antipsychotic therapies at discharge:  No   Has Patient had three or more failed trials of antipsychotic monotherapy by history:  No  Recommended Plan for Multiple Antipsychotic Therapies:  NA   Discharge Orders   Future Orders Complete By Expires     Diet - low sodium heart healthy  As directed     Discharge instructions  As directed     Comments:      Take all of your medications as  directed. Be sure to keep all of your follow up appointments.  If you are unable to keep your follow up appointment, call your Doctor's office to let them know, and reschedule.  Make sure that you have enough medication to last until your appointment. Be sure to get plenty of rest. Going to bed at the same time each night will help. Try to avoid sleeping during the day.  Increase your activity as tolerated. Regular exercise will help you to sleep better and improve your mental health. Eating a heart healthy diet is recommended. Try to avoid salty or fried foods. Be sure to avoid all alcohol and illegal drugs.    Increase activity slowly  As directed         Medication List    STOP taking these medications       sertraline 100 MG tablet  Commonly known as:  ZOLOFT     WELLBUTRIN SR 100 MG 12 hr tablet  Generic drug:  buPROPion      TAKE these medications     Indication   FLUoxetine 40 MG capsule  Commonly known as:  PROZAC  Take 1 capsule (40 mg total) by mouth daily.   Indication:  Depression     hydrOXYzine 50 MG tablet  Commonly known as:  ATARAX/VISTARIL  Take one tablet 3 x a day and then 1 at bedtime.   Indication:  Anxiety Neurosis, Sedation  lamoTRIgine 25 MG tablet  Commonly known as:  LAMICTAL  Take 2 tablets (50 mg total) by mouth 2 (two) times daily.   Indication:  Manic-Depression     levothyroxine 112 MCG tablet  Commonly known as:  SYNTHROID, LEVOTHROID  Take 112 mcg by mouth daily.    for hypothyroid   ramelteon 8 MG tablet  Commonly known as:  ROZEREM  Take 1 tablet (8 mg total) by mouth at bedtime.   Indication:  Trouble Sleeping     topiramate 200 MG tablet  Commonly known as:  TOPAMAX  Take 1 tablet (200 mg total) by mouth 2 (two) times daily.   Indication:  Manic-Depression that is Resistant to Treatment           Follow-up Information   Follow up with Arna Medici On 06/09/2013. (Walk in on this date for hospital discharge appointment.   Walk in clinic is 7:45 - 11 am.)    Contact information:   Physical Address: [address] [place], [place] [address]  Mailing Address:  [address] [place], [place] [address] Phone: [phone] Fax: [phone]      Follow-up recommendations:   Activities: Resume activity as tolerated. Diet: Heart healthy low sodium diet Tests: Follow up testing will be determined by your out patient provider.  Comments:    Total Discharge Time:  Greater than 30 minutes.  Signed: MASHBURN,NEIL 06/08/2013, 12:58 PM  Patient was seen and evaluated for suicide risk assessment, formulated discharge plans and case discussed with physician extender.Reviewed the information documented and agree with the treatment plan.  Nehemiah Settle., M.D. 06/09/2013 12:47 PM

## 2013-06-11 NOTE — Progress Notes (Signed)
Patient Discharge Instructions:  After Visit Summary (AVS):   Faxed to:  06/11/13 Discharge Summary Note:   Faxed to:  06/11/13 Psychiatric Admission Assessment Note:   Faxed to:  06/11/13 Suicide Risk Assessment - Discharge Assessment:   Faxed to:  06/11/13 Faxed/Sent to the Next Level Care provider:  06/11/13 Faxed to [facility] @ [phone]  Jerelene Redden, 06/11/2013, 4:09 PM

## 2013-06-27 IMAGING — US US PELV - US TRANSVAGINAL
1 series · 14 of 25 positions shown · non-contrast
Comparison: none

REASON FOR EXAM: C/O lower abd. pain-states it is her ovaries
COMMENTS:

[Series 1: us pelv - us transvaginal · 0.31mm/px · 14 of 108 slices shown]
[im 1/108]
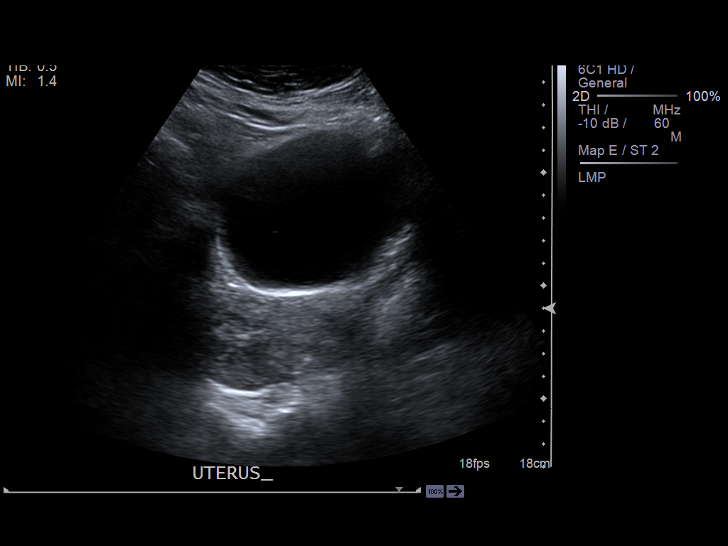
[im 9/108]
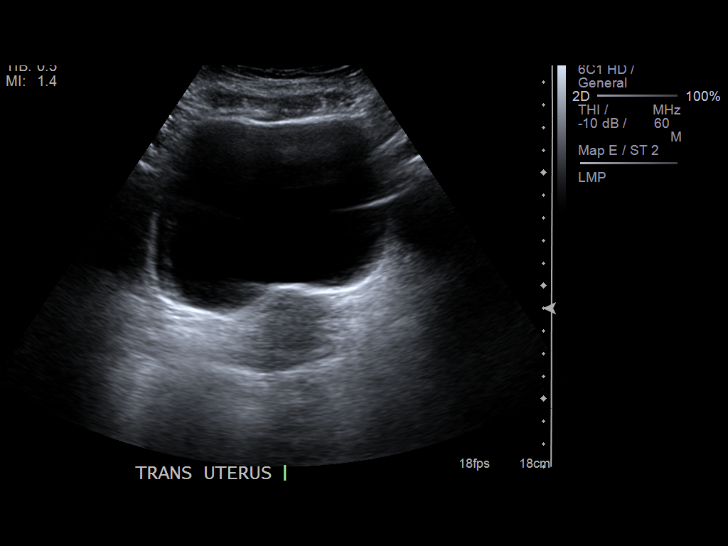
[im 18/108]
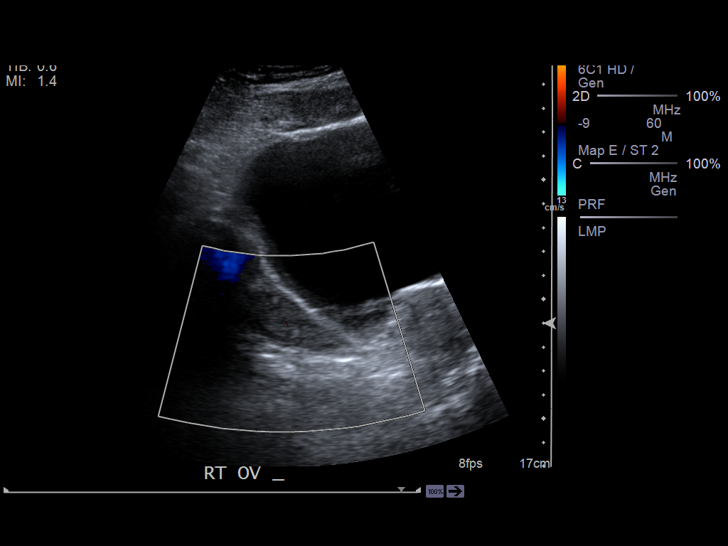
[im 27/108]
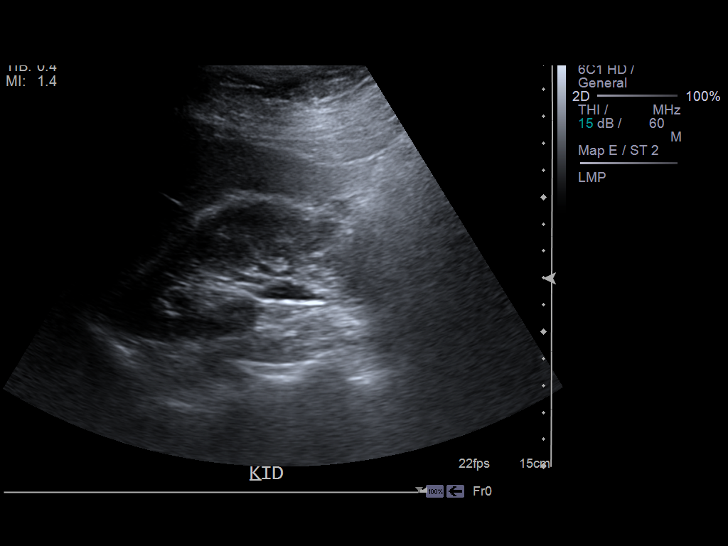
[im 36/108]
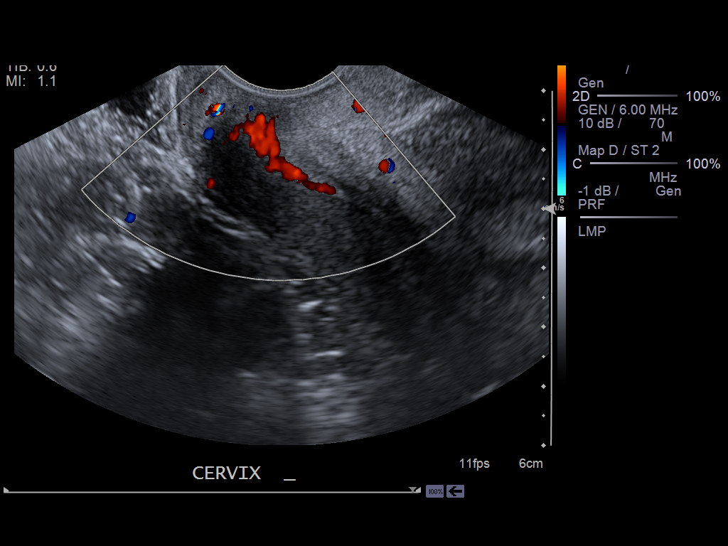
[im 41/108]
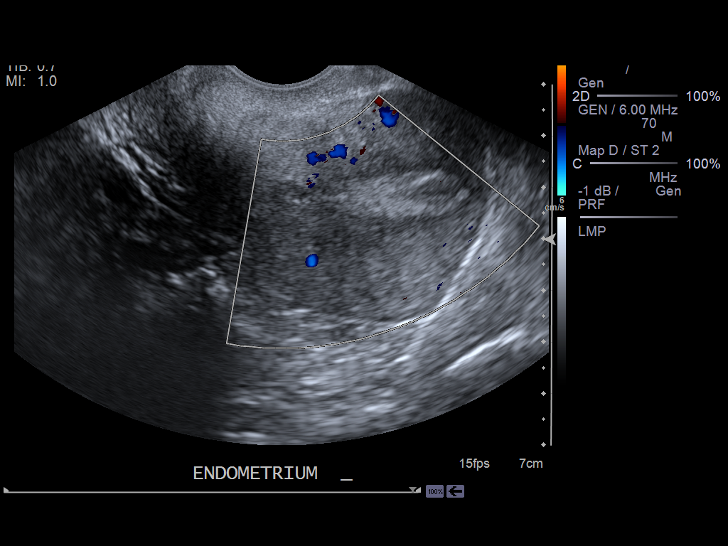
[im 50/108]
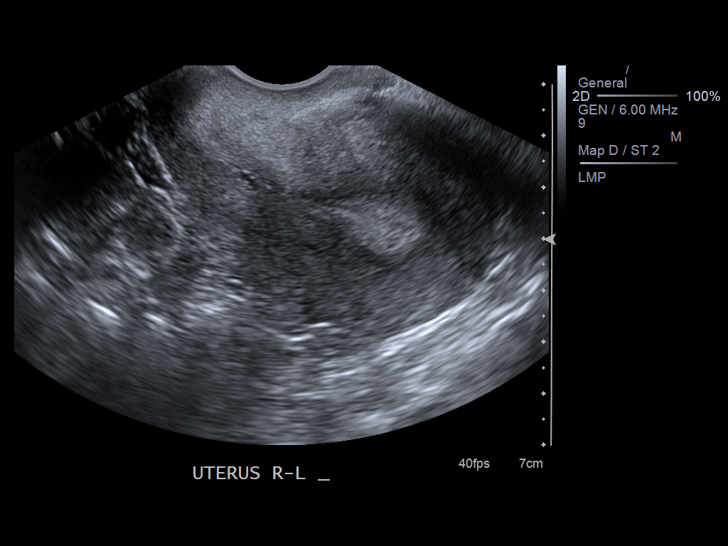
[im 58/108]
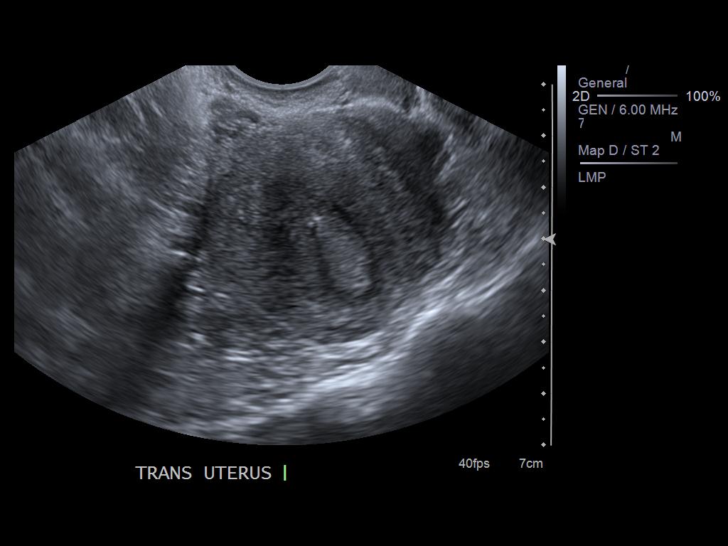
[im 67/108]
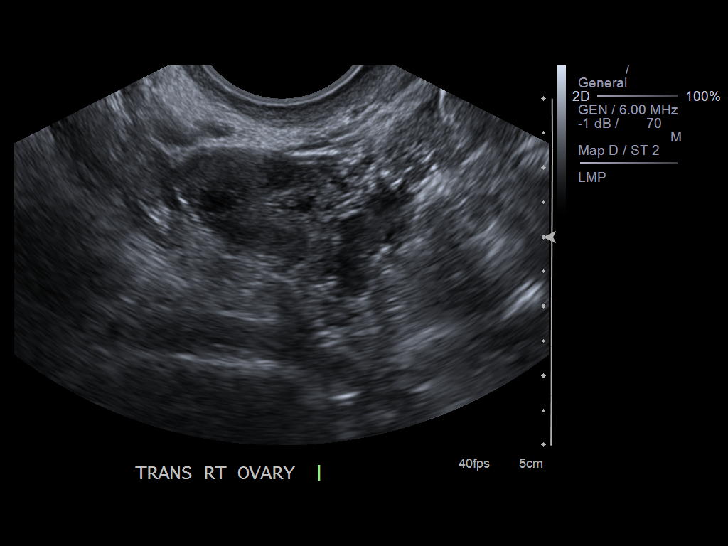
[im 72/108]
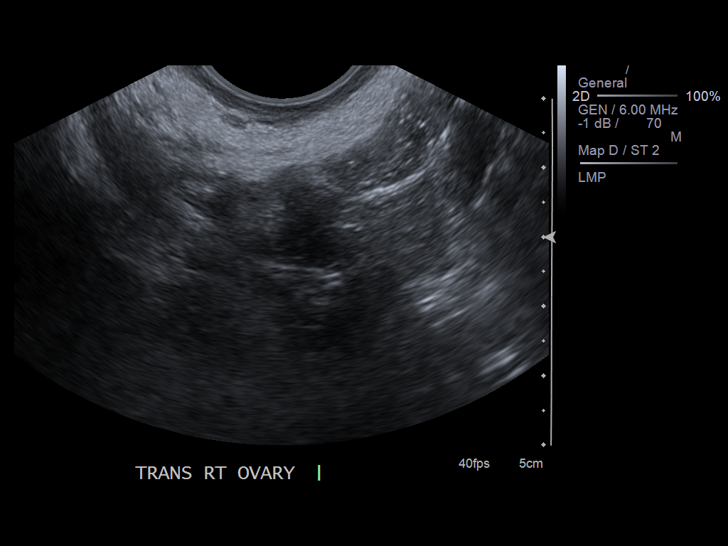
[im 81/108]
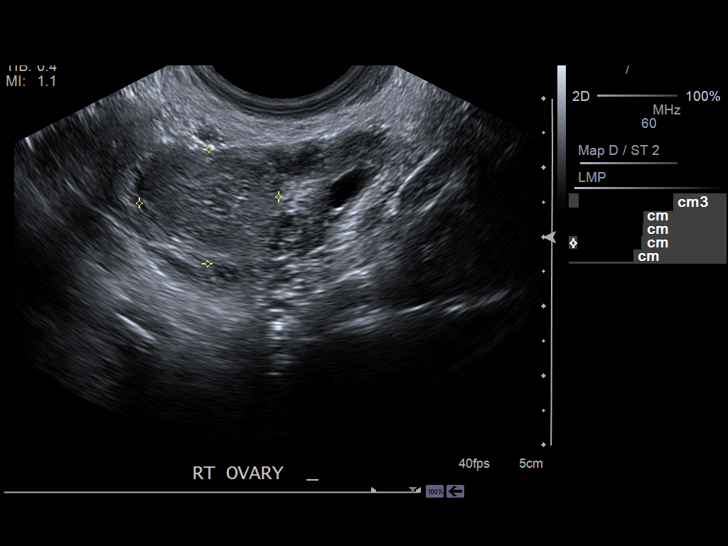
[im 90/108]
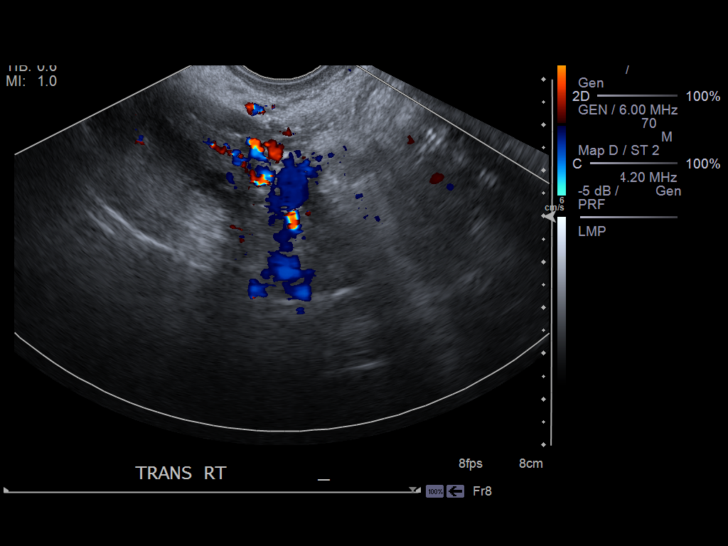
[im 99/108]
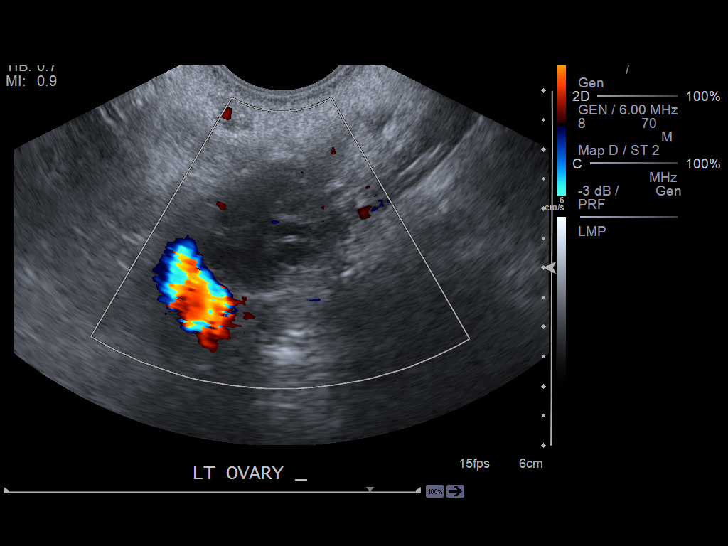
[im 108/108]
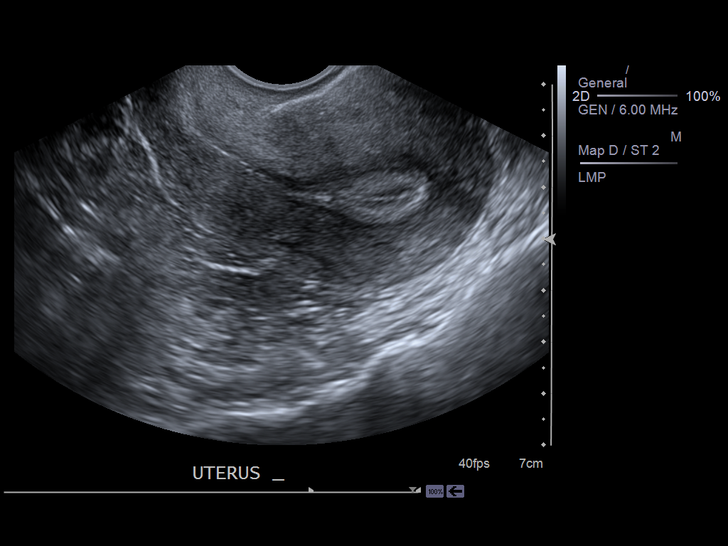

[14 of 25 positions shown; findings below may reference images not displayed]

PROCEDURE:     US  - US PELVIS EXAM W/TRANSVAGINAL  - March 22, 2013  [DATE]

RESULT:     Transabdominal and transvaginal imaging was performed through
the pelvis. The uterus is normal in echotexture and contour and measures
x 4.1 x 5.4 cm. The endometrial stripe measures 9.3 mm in thickness. There
is a small amount of free fluid in the cul-de-sac. The right ovary measures
3.4 x 2.1 x 1.9 cm. There is a solid appearing slightly hypoechoic structure
with internal echoes associated with the right ovary measuring 2 x 1.6 x
cm. The left ovary measures 2.9 x 2 x 2.1 cm. Vascularity of the ovaries is
normal.
IMPRESSION: 1. The uterus is normal in appearance.
2. The left ovary is normal in size and echotexture.
3. The right ovary is normal in size but there is a solid appearing
component that is nonspecific. With compared to study 23 February, 2011 this
appears new. This will merit followup.

[REDACTED]

## 2013-08-09 IMAGING — CR DG ANKLE COMPLETE 3+V*R*
3 series · 3 of 3 positions shown · non-contrast
Comparison: None.

CLINICAL DATA: Injury 2 days ago pain and swelling with tingling of
his toes

RIGHT ANKLE - COMPLETE 3+ VIEW

[view not recorded (1 of 3)]
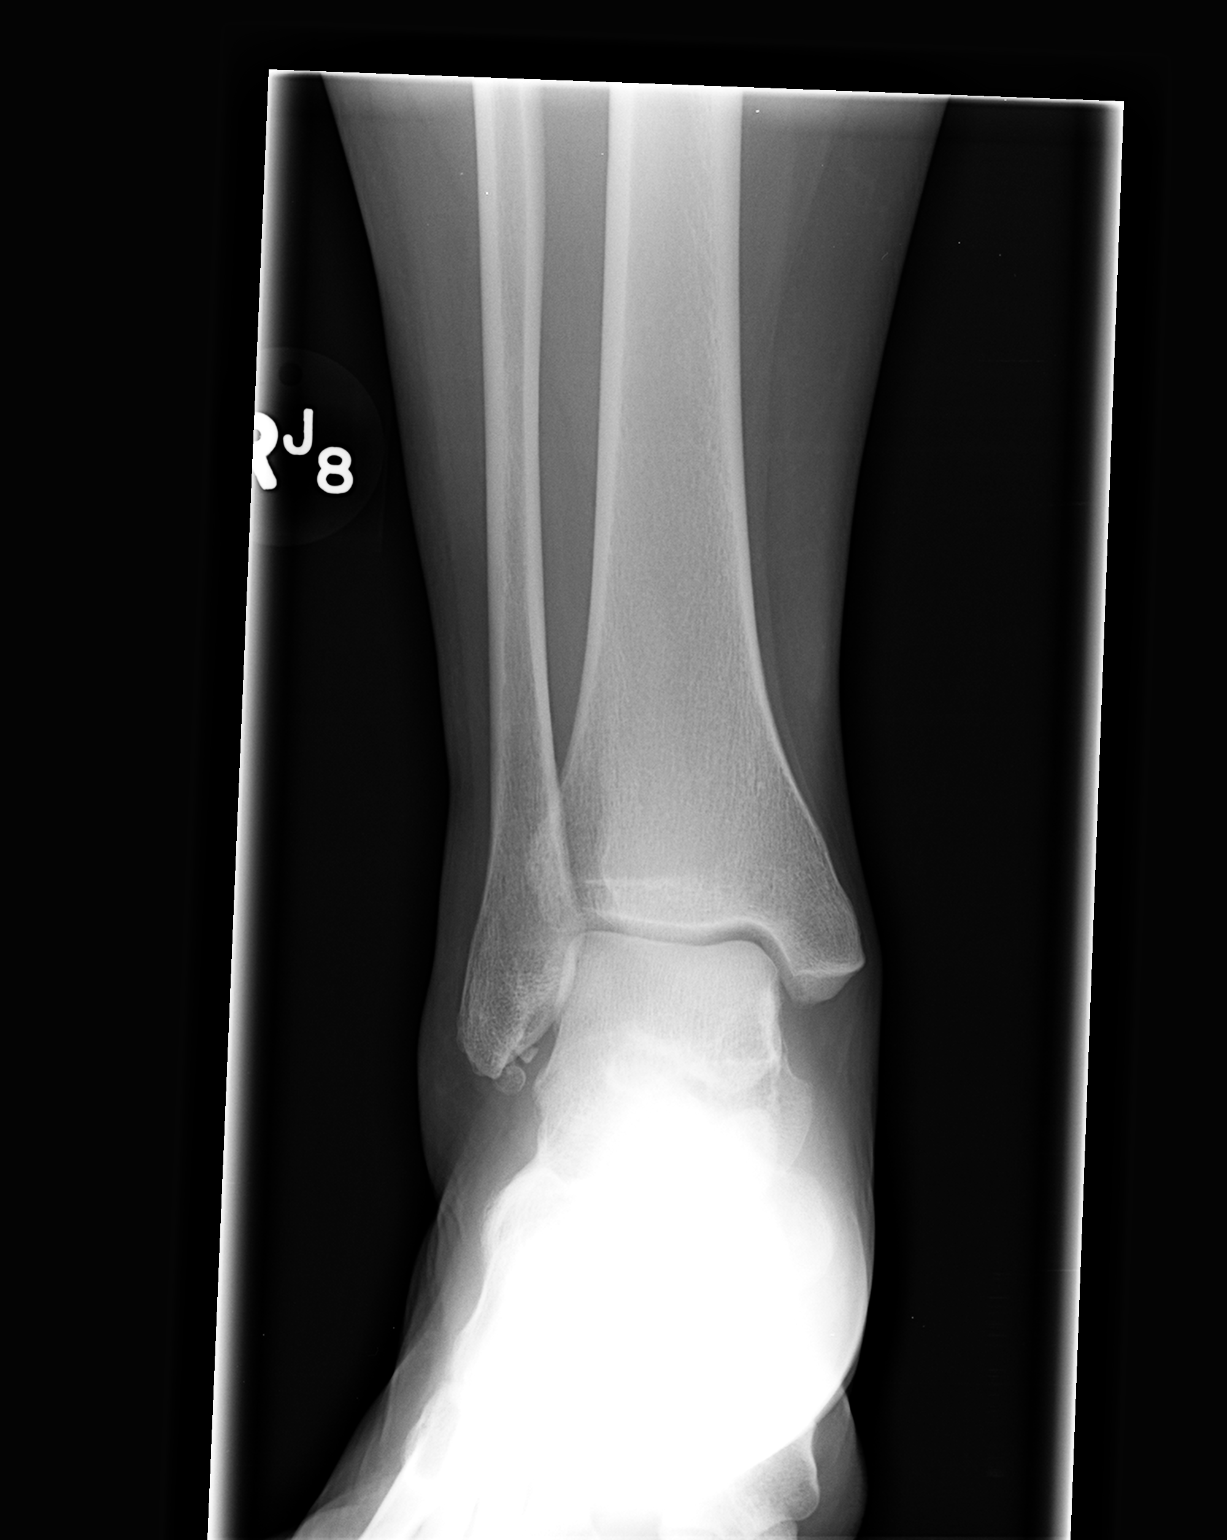

[view not recorded (2 of 3)]
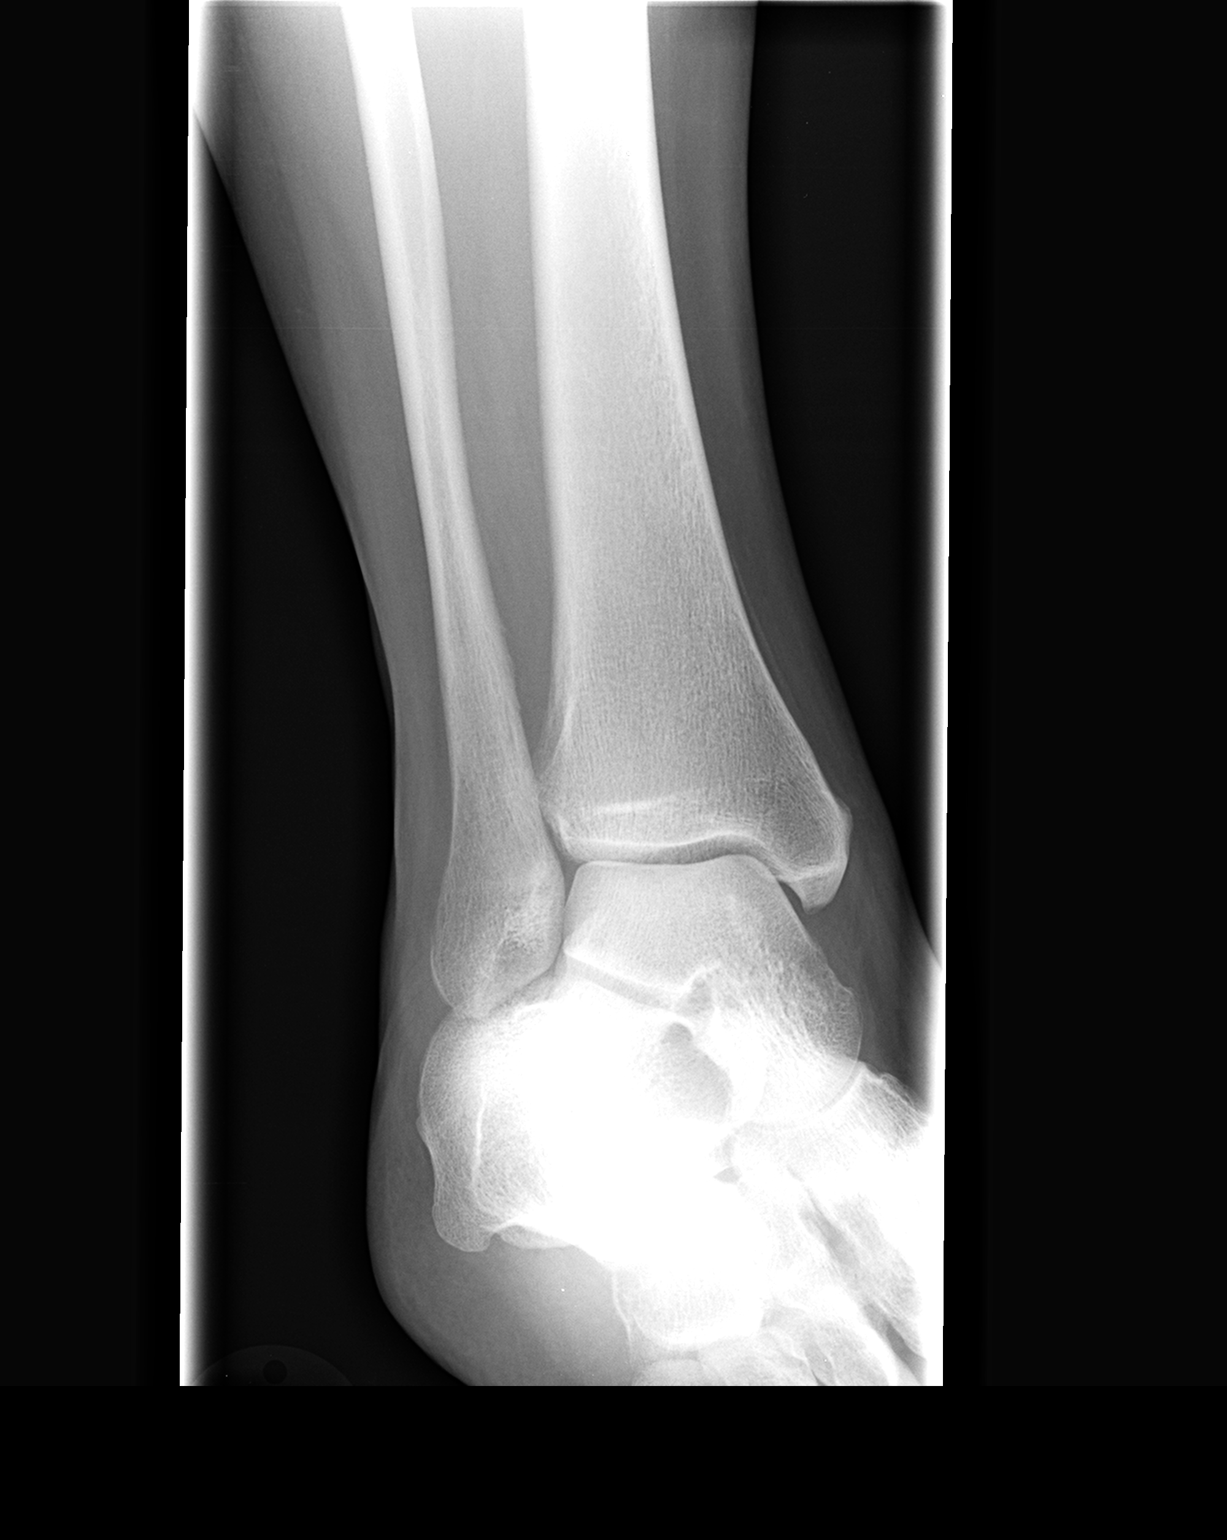

[view not recorded (3 of 3)]
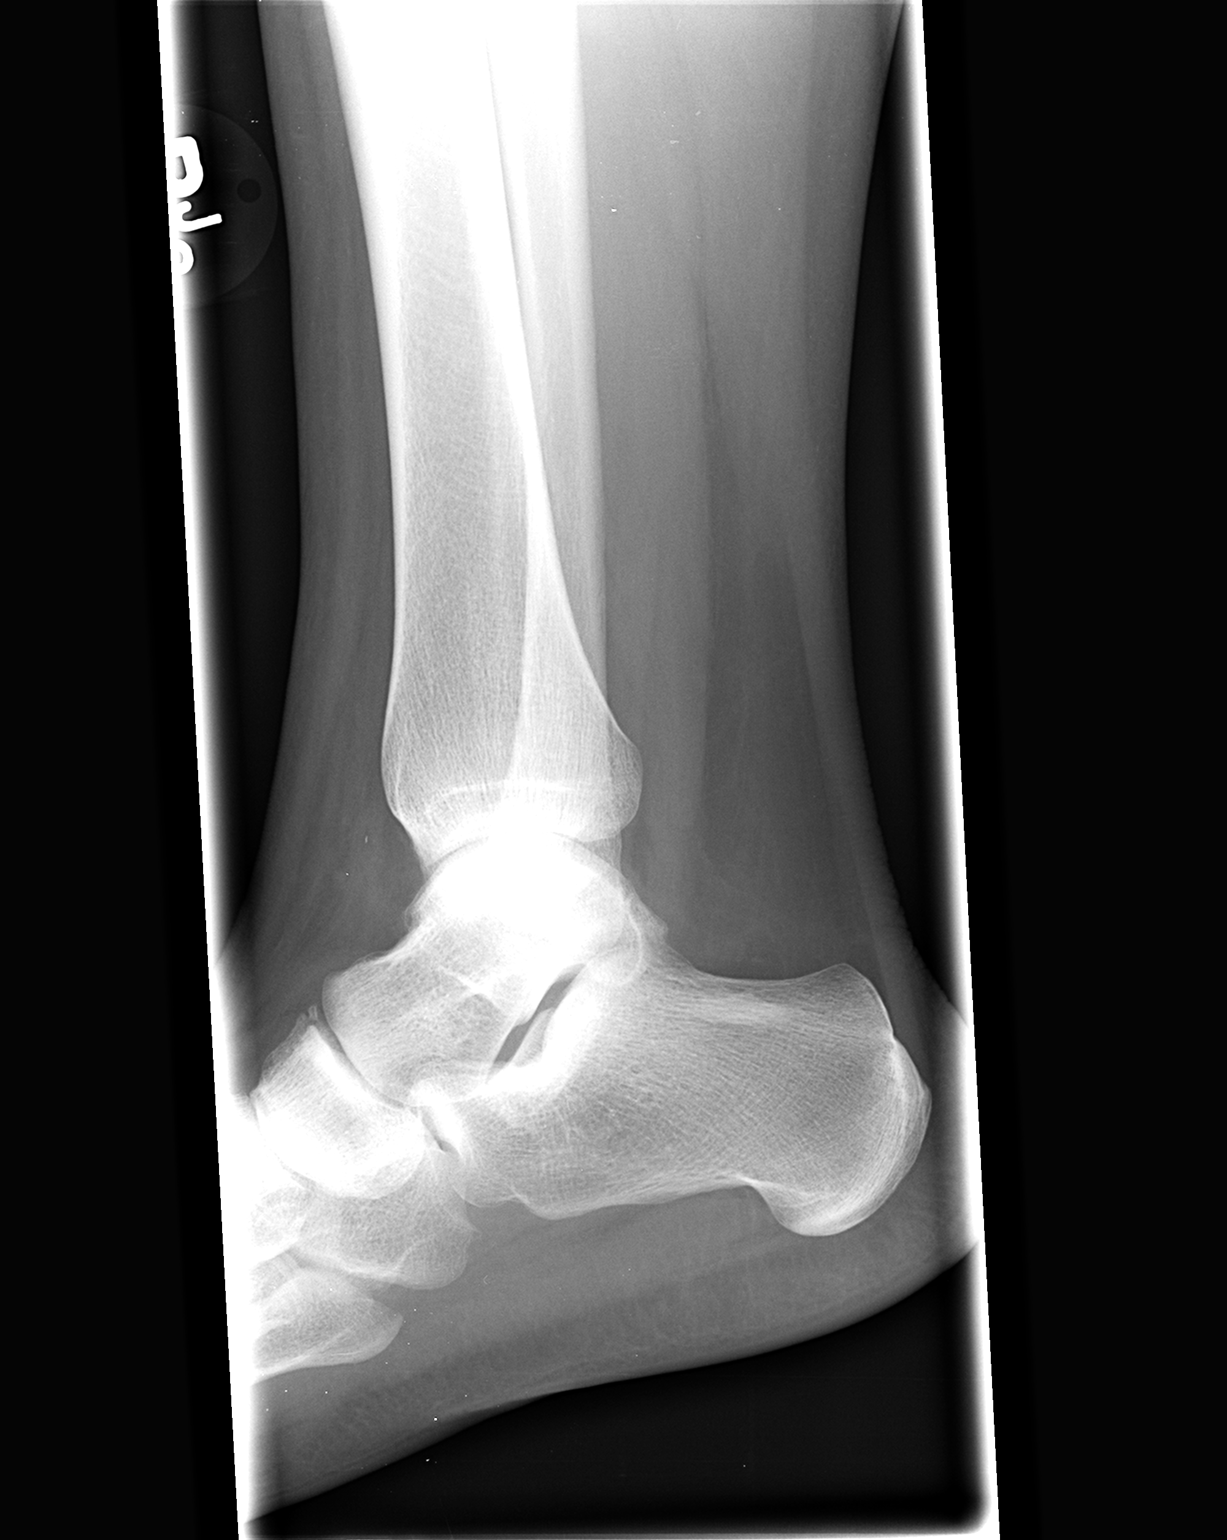

[3 of 3 positions shown; findings below may reference images not displayed]

FINDINGS: Three views of the right ankle submitted.  No acute
fracture or subluxation.  Mild soft tissue swelling adjacent to
lateral malleolus.  Small well corticated fragment adjacent to
distal fibula most likely due to prior injury.
IMPRESSION: No acute fracture or subluxation.  Mild lateral soft tissue
swelling.

## 2013-08-27 ENCOUNTER — Inpatient Hospital Stay: Payer: Self-pay | Admitting: Psychiatry

## 2013-08-27 LAB — PREGNANCY, URINE: Pregnancy Test, Urine: NEGATIVE m[IU]/mL

## 2013-08-27 LAB — CBC
MCH: 30 pg (ref 26.0–34.0)
RBC: 4.42 10*6/uL (ref 3.80–5.20)
WBC: 8.7 10*3/uL (ref 3.6–11.0)

## 2013-08-27 LAB — ETHANOL
Ethanol %: <0.003 % (ref 0.000–0.080)
Ethanol: <3 mg/dL

## 2013-08-27 LAB — URINALYSIS, COMPLETE
Bilirubin,UR: NEGATIVE
Glucose,UR: NEGATIVE mg/dL (ref 0–75)
Ketone: NEGATIVE
RBC,UR: NONE SEEN /HPF (ref 0–5)
WBC UR: 1 /HPF (ref 0–5)

## 2013-08-27 LAB — DRUG SCREEN, URINE
Amphetamines, Ur Screen: NEGATIVE (ref ?–1000)
Barbiturates, Ur Screen: NEGATIVE (ref ?–200)
Cannabinoid 50 Ng, Ur ~~LOC~~: NEGATIVE (ref ?–50)
Cocaine Metabolite,Ur ~~LOC~~: NEGATIVE (ref ?–300)
Opiate, Ur Screen: NEGATIVE (ref ?–300)

## 2013-08-27 LAB — SALICYLATE LEVEL: Salicylates, Serum: 2.4 mg/dL

## 2013-08-27 LAB — COMPREHENSIVE METABOLIC PANEL
Anion Gap: 5 — ABNORMAL LOW (ref 7–16)
BUN: 15 mg/dL (ref 7–18)
Calcium, Total: 8.6 mg/dL (ref 8.5–10.1)
Creatinine: 0.9 mg/dL (ref 0.60–1.30)
EGFR (African American): >60
Glucose: 70 mg/dL (ref 65–99)
Potassium: 4.4 mmol/L (ref 3.5–5.1)
SGPT (ALT): 22 U/L (ref 12–78)
Sodium: 138 mmol/L (ref 136–145)

## 2013-08-27 LAB — ACETAMINOPHEN LEVEL: Acetaminophen: <2 ug/mL

## 2013-10-14 ENCOUNTER — Emergency Department: Payer: Self-pay | Admitting: Internal Medicine

## 2013-10-14 LAB — URINALYSIS, COMPLETE
Bacteria: NONE SEEN
Bilirubin,UR: NEGATIVE
Blood: NEGATIVE
Ketone: NEGATIVE
Leukocyte Esterase: NEGATIVE
Nitrite: NEGATIVE
Ph: 5 (ref 4.5–8.0)
Protein: NEGATIVE
RBC,UR: NONE SEEN /HPF (ref 0–5)
Specific Gravity: 1.013 (ref 1.003–1.030)
Squamous Epithelial: <1
WBC UR: 1 /HPF (ref 0–5)

## 2013-10-14 LAB — COMPREHENSIVE METABOLIC PANEL
Bilirubin,Total: 0.2 mg/dL (ref 0.2–1.0)
Calcium, Total: 9.1 mg/dL (ref 8.5–10.1)
Chloride: 107 mmol/L (ref 98–107)
Co2: 27 mmol/L (ref 21–32)
Creatinine: 0.75 mg/dL (ref 0.60–1.30)
EGFR (African American): >60
EGFR (Non-African Amer.): >60
Glucose: 85 mg/dL (ref 65–99)
SGOT(AST): 15 U/L (ref 15–37)
SGPT (ALT): 29 U/L (ref 12–78)
Total Protein: 7.6 g/dL (ref 6.4–8.2)

## 2013-10-14 LAB — CBC
HCT: 43.2 % (ref 35.0–47.0)
HGB: 14.6 g/dL (ref 12.0–16.0)
RBC: 4.94 10*6/uL (ref 3.80–5.20)
RDW: 13.3 % (ref 11.5–14.5)

## 2014-01-25 LAB — URINALYSIS, COMPLETE
BILIRUBIN, UR: NEGATIVE
BLOOD: NEGATIVE
Glucose,UR: NEGATIVE mg/dL (ref 0–75)
Ketone: NEGATIVE
Leukocyte Esterase: NEGATIVE
NITRITE: NEGATIVE
PROTEIN: NEGATIVE
Ph: 7 (ref 4.5–8.0)
RBC,UR: <1 /HPF (ref 0–5)
SPECIFIC GRAVITY: 1.003 (ref 1.003–1.030)
Squamous Epithelial: <1

## 2014-01-25 LAB — COMPREHENSIVE METABOLIC PANEL
ALBUMIN: 3.7 g/dL (ref 3.4–5.0)
ALT: 25 U/L (ref 12–78)
Alkaline Phosphatase: 74 U/L
Anion Gap: 6 — ABNORMAL LOW (ref 7–16)
BILIRUBIN TOTAL: 0.2 mg/dL (ref 0.2–1.0)
BUN: 12 mg/dL (ref 7–18)
Calcium, Total: 8.6 mg/dL (ref 8.5–10.1)
Chloride: 109 mmol/L — ABNORMAL HIGH (ref 98–107)
Co2: 23 mmol/L (ref 21–32)
Creatinine: 0.83 mg/dL (ref 0.60–1.30)
EGFR (African American): >60
EGFR (Non-African Amer.): >60
GLUCOSE: 81 mg/dL (ref 65–99)
Osmolality: 274 (ref 275–301)
Potassium: 3.6 mmol/L (ref 3.5–5.1)
SGOT(AST): 22 U/L (ref 15–37)
Sodium: 138 mmol/L (ref 136–145)
Total Protein: 7.2 g/dL (ref 6.4–8.2)

## 2014-01-25 LAB — DRUG SCREEN, URINE
AMPHETAMINES, UR SCREEN: NEGATIVE (ref ?–1000)
BENZODIAZEPINE, UR SCRN: NEGATIVE (ref ?–200)
Barbiturates, Ur Screen: NEGATIVE (ref ?–200)
COCAINE METABOLITE, UR ~~LOC~~: NEGATIVE (ref ?–300)
Cannabinoid 50 Ng, Ur ~~LOC~~: NEGATIVE (ref ?–50)
MDMA (Ecstasy)Ur Screen: NEGATIVE (ref ?–500)
METHADONE, UR SCREEN: NEGATIVE (ref ?–300)
Opiate, Ur Screen: NEGATIVE (ref ?–300)
Phencyclidine (PCP) Ur S: NEGATIVE (ref ?–25)
Tricyclic, Ur Screen: NEGATIVE (ref ?–1000)

## 2014-01-25 LAB — TSH: THYROID STIMULATING HORM: 22 u[IU]/mL — AB

## 2014-01-25 LAB — CBC
HCT: 40.9 % (ref 35.0–47.0)
HGB: 13.8 g/dL (ref 12.0–16.0)
MCH: 30 pg (ref 26.0–34.0)
MCHC: 33.9 g/dL (ref 32.0–36.0)
MCV: 89 fL (ref 80–100)
PLATELETS: 229 10*3/uL (ref 150–440)
RBC: 4.61 10*6/uL (ref 3.80–5.20)
RDW: 13.6 % (ref 11.5–14.5)
WBC: 9.4 10*3/uL (ref 3.6–11.0)

## 2014-01-25 LAB — ETHANOL

## 2014-01-25 LAB — SALICYLATE LEVEL: Salicylates, Serum: 3.5 mg/dL — ABNORMAL HIGH

## 2014-01-25 LAB — ACETAMINOPHEN LEVEL: Acetaminophen: <2 ug/mL

## 2014-01-26 ENCOUNTER — Inpatient Hospital Stay: Payer: Self-pay | Admitting: Psychiatry

## 2014-01-26 LAB — SALICYLATE LEVEL: SALICYLATES, SERUM: 3.6 mg/dL — AB

## 2014-01-26 LAB — PREGNANCY, URINE: Pregnancy Test, Urine: NEGATIVE m[IU]/mL

## 2015-01-28 ENCOUNTER — Emergency Department: Payer: Self-pay | Admitting: Internal Medicine

## 2015-01-28 LAB — CBC
HCT: 45.1 % (ref 35.0–47.0)
HGB: 14.8 g/dL (ref 12.0–16.0)
MCH: 30.2 pg (ref 26.0–34.0)
MCHC: 32.9 g/dL (ref 32.0–36.0)
MCV: 92 fL (ref 80–100)
Platelet: 262 10*3/uL (ref 150–440)
RBC: 4.91 10*6/uL (ref 3.80–5.20)
RDW: 13.6 % (ref 11.5–14.5)
WBC: 5.4 10*3/uL (ref 3.6–11.0)

## 2015-01-28 LAB — URINALYSIS, COMPLETE
Bacteria: NONE SEEN
Bilirubin,UR: NEGATIVE
Blood: NEGATIVE
Glucose,UR: NEGATIVE mg/dL (ref 0–75)
Ketone: NEGATIVE
Leukocyte Esterase: NEGATIVE
Nitrite: NEGATIVE
Ph: 8 (ref 4.5–8.0)
Protein: NEGATIVE
RBC,UR: NONE SEEN /HPF (ref 0–5)
Specific Gravity: 1.012 (ref 1.003–1.030)
Squamous Epithelial: NONE SEEN
WBC UR: NONE SEEN /HPF (ref 0–5)

## 2015-01-28 LAB — COMPREHENSIVE METABOLIC PANEL
ALT: 31 U/L
AST: 27 U/L
Albumin: 4.1 g/dL
Alkaline Phosphatase: 74 U/L
Anion Gap: 7 (ref 7–16)
BILIRUBIN TOTAL: 0.1 mg/dL — AB
BUN: 15 mg/dL
CO2: 26 mmol/L
Calcium, Total: 9.4 mg/dL
Chloride: 107 mmol/L
Creatinine: 0.83 mg/dL
GLUCOSE: 97 mg/dL
Potassium: 3.6 mmol/L
Sodium: 140 mmol/L
Total Protein: 7.6 g/dL

## 2015-01-28 LAB — DRUG SCREEN, URINE
Amphetamines, Ur Screen: NEGATIVE
Barbiturates, Ur Screen: NEGATIVE
Benzodiazepine, Ur Scrn: POSITIVE
Cannabinoid 50 Ng, Ur ~~LOC~~: NEGATIVE
Cocaine Metabolite,Ur ~~LOC~~: NEGATIVE
MDMA (Ecstasy)Ur Screen: NEGATIVE
Methadone, Ur Screen: NEGATIVE
Opiate, Ur Screen: POSITIVE
Phencyclidine (PCP) Ur S: NEGATIVE
Tricyclic, Ur Screen: NEGATIVE

## 2015-01-28 LAB — SALICYLATE LEVEL: Salicylates, Serum: <4 mg/dL

## 2015-01-28 LAB — ACETAMINOPHEN LEVEL: Acetaminophen: <10 ug/mL

## 2015-01-28 LAB — ETHANOL
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
## 2015-02-25 NOTE — Discharge Summary (Signed)
PATIENT NAME:  Kelsey Maldonado, Kelsey Maldonado MR#:  [id number] DATE OF BIRTH:  1981/06/05  DATE OF ADMISSION:  12/17/2012 DATE OF DISCHARGE:  12/19/2012  HOSPITAL COURSE: See dictated history and physical for details of admission. A 34 year old woman with a history of mental health problems, overdosed on Ambien. Unknown whether she was actually trying to harm herself. Since being in the hospital, she has denied suicidal ideation. Her affect is euthymic. She did not show any physical symptoms of withdrawal. She has been taken off of her Ambien and treated with trazodone for sleep instead and also not given benzodiazepines. The patient has been counseled about the importance of not abusing drugs, including Ambien, and of staying sober and following up with appropriate medication in order to avoid dangerousness in the future. She is agreeable to the plan. She has followup treatment already arranged at [facility] and will be discharged home with followup to be arranged there.   DISCHARGE MEDICATIONS: Trazodone 100 mg at night, Topamax 200 mg twice a day, Zoloft 100 mg twice a day, Synthroid 0.112 mg in the morning, Vistaril 100 mg p.r.n. q.6 hours for anxiety, bupropion XL 300 mg per day.   LABORATORY RESULTS: Admission labs showed a drug screen positive for MDMA. Thyroxin 0.76. TSH 36.8. Alcohol undetectable. Chemistry showed an elevated chloride 111, elevated total protein 8.3. Hematology panel all normal. Urinalysis: Positive bilirubin. Salicylates slightly elevated at 3.9. Pregnancy test negative.   MENTAL STATUS EXAM AT DISCHARGE: Casually dressed and groomed woman, looks her stated age. Cooperative with the interview. Good eye contact, normal psychomotor activity. Speech normal in rate, tone and volume. Affect euthymic, reactive. Mood stated as okay. Thoughts are lucid without loosening of association or delusional thinking. Denies auditory or visual hallucinations. Denies suicidal or homicidal ideation. Short- and long-term  memory grossly intact. Intelligence average. Judgment and insight improved.   DISPOSITION: Discharge home. Follow up with [facility].   DIAGNOSIS, PRINCIPAL AND PRIMARY:  AXIS I: Bipolar disorder, not otherwise specified.   SECONDARY DIAGNOSES:  AXIS I: Sedative hypnotic abuse.  AXIS II: Borderline features.  AXIS III: Hypothyroidism, poorly controlled.  AXIS IV: Moderate chronic stress from burden of illness.  AXIS V: Functioning at time of discharge is 55.   ____________________________ Gonzella Lex, MD jtc:OSi D: 12/19/2012 14:50:33 ET T: 12/20/2012 09:21:51 ET JOB#: [id number]  cc: Gonzella Lex, MD, <Dictator> Gonzella Lex MD ELECTRONICALLY SIGNED 12/20/2012 13:16
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
## 2015-02-25 NOTE — H&P (Signed)
PATIENT NAME:  Kelsey Maldonado, Kelsey Maldonado MR#:  [id number] DATE OF BIRTH:  Jun 23, 1981  DATE OF ADMISSION:  12/17/2012  IDENTIFYING INFORMATION AND CHIEF COMPLAINT: A 34 year old woman brought under involuntary commitment to the Emergency Room for taking an overdose of her prescription medicine. The patient's chief complaint, "I took too much Ambien."   HISTORY OF PRESENT ILLNESS: Information obtained from the patient and the chart. Commitment petition was filed by the patient's mother and IT states that she overdosed on Ambien and also states that she had been making statements implying suicidal ideation and also says that she had been having self injuring behavior. The patient tells me that she is aware that she took an overdose of Ambien, but that she cannot remember doing it. She says she does not know how many Ambien she took. The last thing she remembers was having a discussion with her mother about money. Next thing she remembers EMS was picking her up and bringing her to the hospital. The patient denied that she had been having any thoughts about wanting to die or wanting to kill herself. She says that her recent mood has been pretty stable. Not feeling more depressed than usual. Sleeps reasonably well with her current medication which is a fairly high dose of Ambien. She claims that she has been compliant with other medications although it sounds like it has probably been more with the psychiatric medicine and not her medical treatment. The patient claims that she has not been drinking and does not use any drugs. She says that she has stress from interactions with her family. Denies any psychotic symptoms.   PAST PSYCHIATRIC HISTORY: The patient has a history of years of treatment for depression and mood instability. She does have a positive history of suicide attempts in the past, most recently this past October. She currently sees Dr. Jacqualine Code at [facility]. She was in the hospital here near the end of 2013. She says  that she has been following up appropriately with Dr. Jacqualine Code since then. She does not report a history of psychotic symptoms. The patient minimizes or denies history of substance abuse. She has been diagnosed in the past with PTSD as well as bipolar disorder, but does not give a clear history of mania. She has been on multiple medications without a really clear long-term stabilization. She is currently on disability.   SOCIAL HISTORY: The patient lives with her mother and stepfather  and several members of the extended family. She just recently got her disability. She is hoping that when she gets some money she will go off to live on her own. The patient does not have a very active life spending most of her time around the house doing not much. She does have one child, but does not have custody. The child lives in [place].   PAST MEDICAL HISTORY: The patient has hypothyroidism and has a history of noncompliance with her thyroid medication. She also has a past history supposedly of seizures. She is not currently reporting that she is taking any anticonvulsant medicine.   FAMILY HISTORY: Substance abuse and bipolar disorder.   REVIEW OF SYSTEMS: The patient generally denies symptoms right now. She says that her mood is okay. Denies suicidal ideation. Denies any wish to harm herself. Denies any psychotic symptoms. Says that she sleeps okay on her current medicine. No physical symptoms reported.   CURRENT MEDICATIONS:  1. Topamax 200 mg a day.  2. Wellbutrin XL 300 mg a day.  3. Vistaril 100  mg 4 times a day for anxiety.  4. Melatonin 3 mg at night. 5. Ambien 10 mg to 20 mg at night. The patient is reporting that the dose is 20 and the notes from [place] say 20, but when I checked the prescription data base, I do not see that she has actually been getting enough pills prescribed to do that.   ALLERGIES: No known drug allergies.   MENTAL STATUS EXAM: Slightly disheveled woman, looks her stated age,  cooperative with the interview and exam. Eye contact good. Psychomotor activity normal. Speech normal in rate, tone and volume. Affect euthymic, slightly blunted but not extremely so. Mood stated as okay. Thoughts appear to be grossly organized, lucid, no evidence of delusional thinking. Denies auditory or visual hallucinations. Denies any suicidal or homicidal ideation. Alert and oriented x 4, appears to probably be of average intelligence, short and long term memory impaired, specifically around the time that she took the Ambien, but at this point she is having intact ongoing short term memory.   PHYSICAL EXAMINATION: GENERAL: The patient does not appear to be in any acute distress. She has a couple of small scabs on her face which she tells me came from pimples that she picked. No other skin lesions identified. I did not see any sign on her arms of self-mutilation.  HEENT: Head symmetric. Oral mucosa dry.  NECK AND BACK: Nontender.  NEUROLOGIC: Full range of motion at all extremities. Normal gait. Strength and reflexes symmetric and normal throughout. Cranial nerves symmetric and normal.  LUNGS: Clear without any wheezes.  HEART: Regular rate and rhythm.  ABDOMEN: Soft, nontender. Normal bowel sounds.  VITAL SIGNS: Temperature 98.3, pulse 72, respirations 18, blood pressure 103/54.   LABORATORY RESULTS: Admission labs showed a drug screen that is positive for MDMA, and otherwise negative. Thyroxine is 0.76, which is at the lower level of normal, but her thyroid stimulating hormone is 36.8, obviously elevated. Alcohol undetected. Chemistry shows an elevated protein at 8.3 and elevated chloride 111, otherwise unremarkable. CBC normal. Urinalysis does not appear to be infected. Pregnancy test negative. Salicylates slightly elevated, but not toxic at 3.9. Acetaminophen undetectable.   ASSESSMENT: A 34 year old woman with a history of bipolar disorder, probably type II or not otherwise specified or  borderline personality disorder. Petition alleges that she has been abusing prescription drugs. The patient admits that she took an overdose yesterday, but claims she cannot remember it. Denies any other recent increase in symptoms. Currently she presents as calm and not agitated. Given her history of impulsivity in the past and this current overdose, it seems more prudent to go ahead and admit her to the hospital.   TREATMENT PLAN: Admit to psychiatry. Continue current medications, except not the Ambien. We will replace that with trazodone for sleep. She will be engaged in groups and activities on the unit and we will try to get further collateral information from the family and notify [place].   DIAGNOSIS PRINCIPLE AND PRIMARY:   AXIS I: Bipolar disorder type II mixed.   SECONDARY DIAGNOSES:  AXIS I: Hypnotic abuse.   AXIS II: Borderline traits.   AXIS III: Hypothyroidism, history of seizures.     AXIS IV:  Moderate to severe from interaction with family.   AXIS V: Functioning at time of assessment 40. ____________________________ Gonzella Lex, MD jtc:sb D: 12/17/2012 13:35:25 ET T: 12/17/2012 13:46:32 ET JOB#: [id number]  cc: Gonzella Lex, MD, <Dictator> Gonzella Lex MD ELECTRONICALLY SIGNED 12/17/2012 16:47

## 2015-02-25 NOTE — Consult Note (Signed)
PATIENT NAME:  Kelsey Maldonado, Kelsey Maldonado MR#:  [id number] DATE OF BIRTH:  1981/09/10  DATE OF CONSULTATION:  08/27/2013  CONSULTING PHYSICIAN:  Tron Flythe S. Gretel Acre, MD  REASON FOR CONSULTATION: "I don't know what happened."   HISTORY OF PRESENT ILLNESS: The patient is a 34 year old female with a long history of mood instability, bipolar disorder, and borderline personality disorder, who was admitted after she overdosed on her pills. During my interview, the patient reported that she does not remember what has happened. She reported that she only remembered that she went to the church in a [place] and she came back in a car. She reported there were 2 other persons in the car. Her stepfather got upset with her because she came back in a car which was inappropriate for her. She does not remember any other details. However, she was told that she took an overdose of her prescription medications.   She reported that she usually follows with Dr. Jacqualine Code and gets a 54-monthsupply of the medications at a time. The patient currently stays at home most of the time and has a 156year old son who is living [place] his father [place] The patient reported that she had a fight with her stepfather.   The patient is currently taking a combination of Ambien, Prozac, Topamax and gabapentin. Per ER, the patient had empty bottles all in her bag. She stated that she was so stressed out that she does not remember anything about the same. She currently was expressing mixed emotions about being alive at this time.   PAST PSYCHIATRIC HISTORY: The patient has a history of multiple suicide attempts in the past. She was last discharged in May after she attempted suicide and has a history of similar impulsive circumstances. She does not have any history of violence. She only reports partial response to medications. She reported that she follows with Dr. MJacqualine Codewho has been continuing her on the current combination of Wellbutrin, Topamax, gabapentin and  Zoloft. She is not taking any other mood stabilizers. She has a phobia of gaining weight from the medications and has resisted several mood stabilizers in the past.   SUBSTANCE ABUSE HISTORY: The patient reported that she does not use any benzodiazepines and hypnotic medications but has several overdoses on those medications in the past.   SOCIAL HISTORY: The patient currently lives with her stepfather. She reported that she [place] She is in contact with them. She has been staying with her family.   PAST MEDICAL HISTORY: History of hypothyroidism and takes medication for the same. She also has a history of chronic headaches.   FAMILY HISTORY: The patient reported that there is history of substance abuse in her mother and her father. The patient is currently on disability and does not work.   REVIEW OF SYSTEMS:  CONSTITUTIONAL: Denies any fever or chills. No weight changes.  EYES: No double or blurred vision.  RESPIRATORY: No shortness of breath or cough.  CARDIOVASCULAR: Denies any chest pain or orthopnea.  GASTROINTESTINAL: No abdominal pain, nausea, vomiting or diarrhea.  GENITOURINARY: No frequency or urgency.  ENDOCRINE: No heat or cold intolerance.  LYMPHATIC: No anemia or easy bruising.  INTEGUMENTARY: No acne or rash.  MUSCULOSKELETAL: Denies any muscle or joint pain.  NEUROLOGICAL: No tingling or weakness.   PHYSICAL EXAMINATION:  VITAL SIGNS: Temperature 98.5, pulse 76, respirations 19, blood pressure 105/68.  LABORATORIES: Glucose 70, BUN 15, creatinine 0.90, sodium 138, potassium 4.4,  chloride 108, bicarbonate 25, anion gap 5. Blood alcohol level less than 3. Protein 7.1, albumin 3.4, bilirubin 0.2, alk phosphatase 92, AST 23, ALT 22. TSH 16. UDS was positive for MDMA. WBC 8.7, hemoglobin 13.3, hematocrit 38.7, MCV 88.   MENTAL STATUS EXAMINATION: The patient is a moderately built female who appeared her stated age. She was cooperative  with the interview. She maintained fair eye contact.  Somewhat fidgety. Motor activity was noted. Her speech was normal in tone and volume. Her affect appeared anxious. Mood remains the same. Thought process was circumstantial. Thought content was nondelusional. She currently has attempted suicide by overdosing on the pills. She denied having any homicidal ideations. Her insight and judgment are poor about the medications as well as about her suicide attempt. Her intelligence remains appropriate.  DIAGNOSTIC IMPRESSION:  AXIS I: Bipolar I disorder, most recent episode mixed, moderate.  AXIS II: Borderline personality disorder.  AXIS III: Hypothyroidism and migraine headaches.  AXIS IV: Severe.   TREATMENT PLAN:  1.  The patient is currently under involuntary commitment and will be admitted to the inpatient behavioral health unit for stabilization and safety.  2.  She will be restarted back on her medications as follows:   A. Bupropion 100 mg in the morning.   B. Gabapentin 300 mg p.o. t.i.d.   C. Levothyroxine 0.125 mg six in the morning.   D. Seroquel 25 mg q.12 hours.   E.  Zoloft 50 mg p.o. at bedtime.   F.  Topamax 50 mg at bedtime.  3.  Treatment team to follow and will adjust her medications according to her needs.   Thank you for allowing me to participate in the care of this patient.    ____________________________ Cordelia Pen. Gretel Acre, MD usf:np D: 08/27/2013 15:16:36 ET T: 08/27/2013 17:05:10 ET JOB#: [id number]  cc: Cordelia Pen. Gretel Acre, MD, <Dictator> Jeronimo Norma MD ELECTRONICALLY SIGNED 08/31/2013 14:46

## 2015-02-25 NOTE — Discharge Summary (Signed)
PATIENT NAME:  Kelsey Maldonado, OSTROVSKY MR#:  [id number] DATE OF BIRTH:  [birth year]  DATE OF ADMISSION:  02/14/2013  DATE OF DISCHARGE:  02/18/2013  HOSPITAL COURSE:  See dictated History and Physical for details of admission. This 34 year old woman was admitted to the hospital shortly after her last discharge with complaints that she was still thinking of burning herself. The patient has chronic self-injury thoughts and chronic mood lability. In the hospital, minimal medication changes were made, although I did increase her topiramate. The patient was engaged in daily individual and group psychotherapy. She was cooperative with all of this. She did not show any dangerous or suicidal behavior in the hospital. She was lucid, with no sign of psychosis. She was compliant with and agreeable to therapy. At the time of discharge, she stated that her mood was much improved and her affect was at its baseline. Denied any suicidal ideation or thought of hurting herself. Felt more confident, and was agreeable to continue outpatient therapy with [facility].   DISCHARGE MEDICATIONS:  Topiramate 200 mg twice a day, Zoloft 100 mg twice a day, Synthroid 0.125 mg once a day, Neurontin 300 mg q. 8 hours, bupropion SR 200 mg twice a day, morning and afternoon.   LABORATORY RESULTS: Drug screen positive for MDMA, which might represent cross reaction of her medicine. Urinalysis normal. TSH slightly elevated at 10.8. Alcohol undetected. Chemistry panel, slightly elevated chloride 113, elevated glucose 128. CBC normal.   MENTAL STATUS EXAM AT DISCHARGE:  Neatly dressed and groomed woman, looks her stated age, cooperative with the interview. Good eye contact, normal psychomotor activity. Speech normal in rate, tone and volume. Affect euthymic, reactive and appropriate. Mood stated as good. Thoughts are lucid. No evidence of loosening associations or delusions. Denies auditory or visual hallucinations, and shows an upbeat affect. Agrees to  outpatient treatment. Normal intelligence. Good judgment and insight.   DISPOSITION:  Discharge home. Follow up with [facility].   DIAGNOSES, PRINCIPAL AND PRIMARY:  AXIS I:  Bipolar disorder, type II, mixed.   SECONDARY DIAGNOSES: AXIS I:  No further.   AXIS II:  Borderline personality disorder.   AXIS III:  Hypothyroidism, past history of seizures.   AXIS IV:  Moderate to severe, chronic history of mental illness.   AXIS V:  Functioning at time of discharge 55.    ____________________________ Gonzella Lex, MD jtc:mr D: 03/07/2013 14:59:00 ET T: 03/07/2013 18:27:12 ET JOB#: [id number]  cc: Gonzella Lex, MD, <Dictator> Gonzella Lex MD ELECTRONICALLY SIGNED 03/08/2013 16:01

## 2015-02-25 NOTE — Consult Note (Signed)
Brief Consult Note: Diagnosis: major depression.   Patient was seen by consultant.   Consult note dictated.   Recommend further assessment or treatment.   Orders entered.   Discussed with Attending MD.   Comments: Psychiatry: Patient seen. IVC for overdose. Currently calm but did od and has history of suicide attempts and has been non-complient with treatment. Admit to [facility] for stabilazation. See intake note.  Electronic Signatures: Gonzella Lex (MD)  (Signed 12-Feb-14 12:28)  Authored: Brief Consult Note   Last Updated: 12-Feb-14 12:28 by Gonzella Lex (MD)

## 2015-02-25 NOTE — Discharge Summary (Signed)
PATIENT NAME:  Kelsey Maldonado, Kelsey Maldonado MR#:  [id number] DATE OF BIRTH:  [birth year]  DATE OF ADMISSION:  03/15/2013 DATE OF DISCHARGE:  03/25/2013  HOSPITAL COURSE:  See dictated History and Physical for details of admission. A 34 year old woman with a history of mood instability and substance abuse. She came to the hospital having taken an overdose of Ambien and reporting suicidal ideation. In the hospital, she was initially depressed and withdrawn. She did not, however, engage in any suicidal behavior in the hospital. Instead, she participated appropriately in groups and showed gradual improvement in her mood. After reviewing medication history, we settled on a combination of Wellbutrin 200 mg twice a day, gabapentin 300 mg 3 times a day, Zoloft 100 mg twice a day, Topamax 200 mg twice a day as an appropriate mood stabilizing and antidepressant combination. The patient is not currently on any benzodiazepines or Ambien. She has been educated repeatedly about the importance of staying clean and not going back into intoxicating drugs. She admits that she has had a recurrent problem with abusing intoxicating drugs that has led to multiple suicide attempts. She does not really have a stable place to go and her life is in some chaos. We tried to convince her to go to [facility], but she has declined the opportunity on the grounds of possibly losing her disability. Instead, she is going to [facility] for substance abuse treatment. There is a bed available tomorrow and she will be discharged to her family to voluntarily go there. She understands what she is getting into and has been briefed on the importance of really trying to stay the course to stay sober and get her life together. She is agreeable at this time to the plan and is totally denying any suicidal ideation.   MENTAL STATUS EXAM AT DISCHARGE:  Neatly dressed and groomed. Good eye contact. Normal psychomotor activity. Speech normal in rate, tone and volume. Affect  is euthymic, reactive and appropriate. Mood stated as okay. Thoughts are lucid without loosening of associations or delusions. Denies auditory or visual hallucinations. Denies suicidal or homicidal ideation. Shows improved judgment and insight. Normal intelligence.   LABORATORY RESULTS: HIV test done before she was considering going to [facility] was negative. She complained of abdominal pain during her hospital course, and a urinalysis was  negative, but a pelvic ultrasound showed a right ovarian solid mass of unclear etiology. On admission, her drug screen was positive for MDMA, TSH elevated at 5.6, alcohol undetected, chemistry largely unremarkable. CBC normal. Urinalysis on admission unremarkable. Acetaminophen negative. Salicylates elevated at 9.   DISCHARGE MEDICATIONS:  Bupropion 200 mg twice a day, Neurontin 300 mg 3 times a day, levothyroxine 125 mcg per day, Zoloft 100 mg twice a day, Topamax 200 mg twice a day, ibuprofen 600 mg q. 6 hours p.r.n. for pain.   DISPOSITION:  Discharge home briefly, preferably not at all, and instead the family should take her to [facility] house for further treatment.   DIAGNOSIS, PRINCIPAL AND PRIMARY:  AXIS I:  Sedative hypnotic dependence.   SECONDARY DIAGNOSES: AXIS I:  Mood disorder, not otherwise specified.  AXIS II:  Borderline personality disorder.  AXIS III:  Headaches and mass in the pelvis of unclear type.  AXIS IV:  Severe stress from homelessness, lack of real support.  AXIS V:  Functioning at time of discharge 85.     ____________________________ Gonzella Lex, MD jtc:mr D: 03/24/2013 18:25:00 ET T: 03/24/2013 19:44:44 ET JOB#: [id number]  cc: Jenny Reichmann T.  Clapacs, MD, <Dictator>  Gonzella Lex MD ELECTRONICALLY SIGNED 03/25/2013 11:07

## 2015-02-25 NOTE — H&P (Signed)
DATE OF BIRTH:  09-19-1981  SEX:  Female  RACE:  White  AGE:  34 years  DATE OF ADMISSION:  02/14/2013  PLACE OF DICTATION:  [place]  IDENTIFYING INFORMATION:  The patient is a 34 year old white female, not employed and is on disability for bipolar disorder. She is married, but is in the process of getting a divorce. She is separated for one year, and currently lives with her mother and other family members. The patient was recently discharged from [facility] on 12/19/2012, and comes back for readmission with the chief complaint "I was going to burn myself, and having suicidal thoughts, and I am having suicide attempt problems and thinking of suicide."  HISTORY OF PRESENT ILLNESS: The patient reports that she has been taking her medications as prescribed on a regular basis.  The patient was discharged on the following medications on 12/19/2012:  Trazodone 100 mg at bedtime, Topamax 200 mg twice a day, Zoloft 100 mg twice a day, Synthroid 0.112 mcg in the morning,  Ativan one mg q. 6 hours p.r.n. for anxiety, Bupropion XL 300 mg per day. The patient has an appointment with Dr.  Randel Books, and she plans to keep her follow up appointment.   PAST PSYCHIATRIC HISTORY:  The patient has been treated for depression and bipolar disorder and mood instability for many years. Does have positive history of suicide attempts in the past. She is being followed by Dr. Randel Books at [facility], and admits that she keeps up her  follow up appointment. Does not even know when her next appointment is, as she has to look at her card at home. She has been diagnosed with PTSD and bipolar disorder.   SOCIAL HISTORY:  The patient lives with her mother and step-father, and several members of the family. She had been on disability. She reports that she has been married for 6 years, but  separated for one year and trying to get a divorce. She spends most of her life dealing with  family members.  PAST MEDICAL HISTORY:  History of hypothyroidism, and is on medication for the same. Has past history of seizures, but currently she reports no problems, and is not on any medications.   FAMILY HISTORY:  Mental illness, substance abuse and bipolar disorder runs in her family.    SOCIAL HISTORY:  The patient denies drinking alcohol. No street or prescription drug abuse. Does admit to smoking nicotine cigarettes at a rate of  1/2 pack a day for many years.  ALLERGIES:  No known drug allergies.  PHYSICAL EXAMINATION:   VITAL SIGNS:  Temperature 98.3, pulse is 70 per minute, regular, respiratory rate 18 per minute, regular, blood pressure is 112/60 mmHg. HEENT:  Head is normocephalic and atraumatic.  Eyes PERRLA.  Fundi are benign  no exudate. NECK:  Supple, without any organomegaly thyromegaly.  CHEST:  Normal excursion, normal breath sounds. HEART:  Normal S1, S2, without any murmurs or rubs. ABDOMEN:  Soft. No organomegaly. Bowel sounds heard. RECTAL AND PELVIC:  Deferred. NEUROLOGIC: Gait is normal. Romberg is negative. Cranial nerves II through XII appear intact.  DTRs 2+. Plantars are normal.  MENTAL STATUS EXAMINATION: The patien is alert and oriented to place, person and time. Rather disheveled in appearance. Eye contact is fair. Psychomotor activity is normal. Speech is normal in rate and tone. Aware of  situation that brought her for admission to the hospital. Appears to be frustrated and upset and disgusted about her living  situation and current situation, going through a divorce. Thought process is grossly organized, but still she does not have clear thinking. Does not appear to be responding to internal stimuli. Alert and oriented x 4. Cognition grossly intact. Memory is intact, though recall is difficult, and had to take several chances. She could count money and do serial numbers. Denies any ideas of plans to hurt herself or others, and wants to get help. Insight and  judgment guarded.  IMPRESSIONS:  AXIS I:   1.  Bipolar disorder type 2, mixed. 2.  Nicotine dependence.  AXIS II:  Personality disorder with borderline trait.  AXIS III:   1.  Hypothyroidism. 2.  History of seizures.  AXIS IV:  Moderate to severe, long history of mental illness and constant problem with interaction with family members and with her living situation.  AXIS V:  GAF 25.  PLAN:  The patient will be admitted to [facility] under close observation. She will be started back on all of her medications. During her stay in the hospital, her medications may be adjusted so her symptoms will be under control. At the time of discharge, the patient will be stable and will have better insight into her problems and having enough coping skills. Appropriate follow up appointment will be made in the community.   ____________________________ Wallace Cullens. Franchot Mimes, MD skc:mr D: 02/14/2013 18:18:00 ET T: 02/14/2013 19:53:51 ET JOB#: [id number]  cc: Arlyn Leak K. Franchot Mimes, MD, <Dictator> Dewain Penning MD ELECTRONICALLY SIGNED 02/15/2013 18:02

## 2015-02-26 NOTE — Consult Note (Signed)
PATIENT NAME:  Kelsey Maldonado, Kelsey Maldonado MR#:  [id number] DATE OF BIRTH:  [birth year]  DATE OF CONSULTATION:  01/26/2014  REFERRING PHYSICIAN:  Lurline Hare, MD CONSULTING PHYSICIAN:  Cordelia Pen. Gretel Acre, MD  REASON FOR CONSULTATION: Overdose on medications.   HISTORY OF PRESENT ILLNESS: The patient is a 35 year old white female with a history of bipolar disorder who presented to the ER after she took an overdose of her prescription medications. The patient reported that she picked up a prescription of Ambien, Prozac, Topamax, and got a refill last night. However, she did not remember that she has took an Ambien and she kept on taking a lot of her pills. Her mother called the ambulance. She does not remember that  she has already taken her medications. The patient was minimizing the events which led to her presentation to the ER. The patient reported that she gets her medications from Dr. Jacqualine Code. She is currently on a combination of Topamax, Prozac, Gabapentin, Synthroid and Ambien. The patient reported that she usually gets a 3 month supply of the medication at a time. However, this time she only got 1 month supply as she was out of a refill and is supposed to have an appointment next month. The patient reported that she lives with her mother and stepfather. The patient stated that she is feeling better. However, she was minimizing her mood symptoms at this time. The patient has been having problems remembering the events which led to her admission. She denied any particular stressors at this time.   PAST PSYCHIATRIC HISTORY: The patient has history of suicide attempts in the past. She has history of mood instability as well as bipolar disorder and previous hospitalizations to this hospital.   SOCIAL HISTORY: The patient has a son but does not have any contact with him. She currently lives with her mother and stepfather. She does not get along well with her stepfather. She stated that she relies a lot on her church.    PAST MEDICAL HISTORY: Hypothyroidism which is frequently complicated by her noncompliance with her medications. She also has history of chronic migraine headaches.   SUBSTANCE ABUSE HISTORY: The patient has long history of substance abuse problems, primarily with sedative hypnotics and overdoses in the past.   FAMILY HISTORY: Positive for depression.   CURRENT MEDICATIONS: Topamax 200 mg twice daily, Ambien 20 mg at night, levothyroxine 0.137 mg per day, Prozac 20 mg daily, Bupropion XL 300 mg in the morning.  ALLERGIES: TOMATO.   REVIEW OF SYSTEMS:  CONSTITUTIONAL: Denies any fever or chills. No weight changes.  EYES: No double or blurred vision.  RESPIRATORY: No shortness of breath or cough.  CARDIOVASCULAR: Denies any chest pain or orthopnea.  GASTROINTESTINAL: No abdominal pain, nausea, vomiting or diarrhea.  GENITOURINARY: No frequency or urgency.  ENDOCRINE: No heat or cold intolerance.  LYMPHATIC: No anemia or easy bruising.  INTEGUMENTARY: No acne or rash.  MUSCULOSKELETAL: Denies any muscle or joint pain.  NEUROLOGIC: No tingling or weakness.  ANCILLARY DATA: Temperature 98.2, pulse 83, respirations 18, blood pressure 100/60.  LABORATORY DATA: Glucose 81, BUN 12, creatinine 0.83, sodium 138, potassium 3.6, chloride 109, bicarbonate 23, GFR 60, anion gap 6, osmolality 274, calcium 8.6. Blood alcohol less than 3. Protein 7.2, albumin 3.7, bilirubin 0.2, alkaline phosphatase 74, AST 22, ALT 25. TSH 22. UDS is negative. WBC 9.4, RBC 4.61, hemoglobin 13.8, hematocrit 40.9, platelet count 229,000, MCV 89, RDW 13.6.   MENTAL STATUS EXAMINATION: The patient is a moderately built  female who appeared her stated age. She was calm and cooperative in the interview. She maintained fair eye contact. Her speech was low in tone and volume. Mood was depressed. Affect was congruent. Thought process was logical, goal-directed. Thought content was nondelusional. She currently denied having any  auditory or visual hallucinations. She recently attempted suicide and was minimizing the event. She showed impaired judgment and insight at this time. Her intelligence was normal. Language was fair. Fund of knowledge appears appropriate.   DIAGNOSTIC IMPRESSION: AXIS I:  1.  Bipolar I disorder, most recent episode mixed. 2.  Polysubstance dependence by history.  AXIS II: None.  AXIS III: Hypothyroidism.  AXIS IV: Severe.  AXIS V: Current global assessment of functioning 25.   TREATMENT PLAN: The patient is currently on involuntary commitment and will be admitted to the behavioral health unit for stabilization and safety.   I will start her on the medications as follows: 1.  Wellbutrin XL 150 mg q. a.m.  2.  Topamax 100 mg p.o. b.i.d.  3.  Synthroid 112 mcg p.o. daily.  4.  Gabapentin 300 mg p.o. b.i.d.   The patient will be evaluated by the treatment team and her medications will be adjusted.   Thank you for allowing me to participate in the care of this patient.  ____________________________ Cordelia Pen. Gretel Acre, MD usf:sb D: 01/26/2014 10:48:19 ET T: 01/26/2014 11:19:27 ET JOB#: [id number]  cc: Cordelia Pen. Gretel Acre, MD, <Dictator> Jeronimo Norma MD ELECTRONICALLY SIGNED 01/26/2014 14:00

## 2015-02-26 NOTE — H&P (Signed)
PATIENT NAME:  Kelsey Maldonado, Kelsey Maldonado MR#:  [id number] DATE OF BIRTH:  28-Oct-1981  DATE OF ADMISSION:  01/26/2014 DATE OF CONSULTATION: 01/27/2014   IDENTIFYING INFORMATION AND CHIEF COMPLAINT: A 34 year old woman admitted to the hospital because of overdose on Ambien.   CHIEF COMPLAINT: "I guess I just made a mistake."   HISTORY OF PRESENT ILLNESS: Information obtained from the patient and the chart. The patient was brought into the hospital intoxicated and confused. She appeared to have taken an overdose of her prescription Ambien. At the time of admission, she claims that she did not know what was going on and was saying that she heard voices. She was not reporting acute suicidal intent.   On interview today, the patient admits that she had gone to pick up her prescription medicines from the pharmacy on her own because she just recently got a car. She appears to normally get 28 of her Ambien at once for a 2-week supply probably. She says she took some of them and then started to get confused and appears to have taken some more, probably took all of it. She says she has been having a lot of stress at home. She blames most of it on the fact that her brother, who also has a substance use and mood problem, has been living at home. She says people have not been taking care of her well. At the same time, the patient admits that she has not been going to [place] regularly and not been taking care of herself very well. She says she occasionally has taken a pain pill but denies that she has been drinking or abusing other drugs. Says that her mood is up and down, sometimes irritable, occasionally will have suicidal thoughts but without any intent or plan. Denies having any hallucinations. Denies any homicidal ideation.   PAST PSYCHIATRIC HISTORY: The patient has had several admissions in the past and has a history of chronic mood instability typical of borderline personality disorder along with a history of substance abuse  with combination of alcohol, opiates, and sedatives as being favorite drugs. She has had several overdoses in the past, often under similar circumstances. She will start taking a little bit because she is anxious and wind up overdosing once she starts to get intoxicated on it. The patient has been doing better since she has had a community support team, follow up with [facility].   She has a history of a diagnosis of bipolar disorder but I think the overall chronic diagnosis is probably more borderline personality disorder and substance abuse. She is currently taking a combination of antidepressants which seem to have been helpful for her. She is prescribed Ambien by her outpatient psychiatrist.   PAST MEDICAL HISTORY: The patient has hypothyroidism. Almost every time I can remember seeing her thyroid test checked, her TSH has been extremely high. In the past, I had assumed that was because she was noncompliant with medication, but I am starting to suspect that she might just not be on enough thyroid medicine because the patient says she thinks she has been taking it correctly.   FAMILY HISTORY: Positive for other people with substance abuse and mood problems.   SOCIAL HISTORY: Lives with her mother and stepfather. A brother who also has similar problems has recently moved into the house. The patient is not working. She gets follow-up treatment at [facility] with a community support team.   CURRENT MEDICATIONS: Wellbutrin XL 300 mg per day, fluoxetine 40 mg per day,  Synthroid 112 mcg per day, Topamax 200 mg twice a day, gabapentin 600 mg in the morning and 300 at night, Ambien 20 mg at night.   ALLERGIES: TOMATOES.   REVIEW OF SYSTEMS: Currently says that she still has anxiety but is not having a panic attack. Not feeling overly depressed. Not hopeless. No suicidal ideation. No hallucinations. Physically, she has no complaints and the rest of the review of systems is negative.   MENTAL STATUS EXAMINATION: Slightly  disheveled woman, looks younger than her stated age, cooperative with the interview. Eye contact good. Psychomotor activity a little fidgety but not unusual. Speech normal rate, tone and volume. Affect mildly anxious but reactive and typical for her. Mood stated as being okay. Thoughts are lucid with no loosening of associations or delusions. Denies auditory or visual hallucinations. Denies suicidal or homicidal ideation. Chronically poor judgment, especially when she is taking pills, but currently she is able to articulate appropriate insight and judgment. Intelligence normal. Fund of knowledge normal. Short and long-term memory both intact.   PHYSICAL EXAMINATION:  SKIN: No acute skin lesions.  HEENT: Pupils equal and reactive. Face symmetric. Oral mucosa dry. Dentition normal.  NECK AND BACK: Nontender. Normal flexion.  EXTREMITIES: Full range of motion at all extremities. Normal gait.  NEUROLOGIC: Strength and reflexes normal and symmetric throughout. Cranial nerves symmetric and normal.  LUNGS: Clear with no wheezes.  HEART: Regular rate and rhythm.  ABDOMEN: Soft, nontender, normal bowel sounds.  VITAL SIGNS: Blood pressure currently 92/63, respirations 18, pulse 77, temperature 97.8.   LABORATORY RESULTS: Chemistry panel showed a chloride slightly elevated at 109. Alcohol not detected. TSH elevated at 22. Drug screen negative. Hematology panel unremarkable. Urinalysis unremarkable.   ASSESSMENT: A 34 year old woman with borderline personality disorder and polysubstance abuse. She made the unfortunate decision to pick up her medicines without supervision and wound up overdosing on them, which is actually a common behavior for her. Currently, she is no longer intoxicated and is regretful about it and shows adequate insight. She is not reporting acute suicidal ideation and not showing signs of acute depression. She has chronic anxiety symptoms  but is able to keep them under control and behave  adequately. The patient is in need of chronic treatment for her substance abuse and mood instability.   TREATMENT PLAN: We discussed options with the patient. I made it clear to her that if we really felt that it would be safer to stay in the hospital longer that that would be okay, but at this point, I did not see any indication to change her prescription medicine. After discussing all of it, the patient concludes that she does not have any intent to harm herself and will be able to take care of herself safely at home. She will continue to have her mother oversee her medicines. She will follow up with [facility]. I think we can safely discharge her home today.   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   SECONDARY DIAGNOSES:  AXIS I:  1.  Depression, not otherwise specified.     2.   Mixed polysubstance abuse.  AXIS II: Borderline personality disorder.  AXIS III: Hypothyroidism, status post Ambien intoxication, now resolved.  AXIS IV: Severe from chronic illness and a disruptive environment at home.  AXIS V: Functioning at time of admission is 34.   ____________________________ Gonzella Lex, MD jtc:np D: 01/27/2014 14:12:35 ET T: 01/27/2014 15:34:14 ET JOB#: [id number]  cc: Gonzella Lex, MD, <Dictator> Gonzella Lex MD ELECTRONICALLY SIGNED 01/27/2014 16:34

## 2015-02-26 NOTE — Discharge Summary (Signed)
PATIENT NAME:  Kelsey Maldonado, Kelsey Maldonado MR#:  [id number] DATE OF BIRTH:  15-Oct-1981  DATE OF ADMISSION:  01/26/2014 DATE OF DISCHARGE:  01/27/2014  HOSPITAL COURSE: See dictated history and physical for details of admission. The patient is being discharged today after my evaluation with her. The patient was admitted to the hospital because of an overdose on Ambien. It is not clear that there was any real suicidal intent. This is, unfortunately, a habitual behavior on her part related to her substance abuse and personality disorder. Currently, she is no longer intoxicated. Not psychotic. Denies suicidal ideation. Denies homicidal ideation. Able to articulate appropriate outpatient treatment plans and goals. There is no indication there would be any benefit to staying in the hospital. I do not think adjusting her medications is indicated at this point. The patient has been counseled about the dangers of overdosing on her medication and specifically about the importance of not taking responsibility for handling her medicines herself but continuing the plan of having her mother oversee her medicines. She is fully agreeable to continuing treatment with community support team and going to [employer] regularly. No change to medicines. Safely, I think, can be discharged today. The patient and I are aware that she has chronic suicidal ideation intermittently which is not likely to respond to treatment at the hospital level but which has been controlled as long as she is not intoxicated outside the hospital and is being treated with outpatient therapies.   DISCHARGE MEDICATIONS: Prozac 40 mg per day, Wellbutrin XL 300 mg per day, Topamax 200 mg twice a day, Synthroid 112 mcg per day, Ambien 20 mg at night. I have not given her any new prescriptions for any of her medicines. She can follow up with [facility] about that.   LABORATORY RESULTS: Drug screen was negative. Alcohol level negative. Chemistry unremarkable. CBC unremarkable.  Urinalysis unremarkable. Her thyroid-stimulating hormone was 22. As I mentioned before, I suspect that she may just not be taking enough thyroid hormone although I have also suspected that that can indicate she is not compliant and I have confronted her about that on multiple occasions.   MENTAL STATUS EXAM AT DISCHARGE: Casually dressed and groomed woman, looks her stated age or younger. Cooperative with the interview. Psychomotor activity a little fidgety. Eye contact normal. Speech normal rate, tone and volume. Affect slightly anxious but reactive and appropriate. Mood stated as okay. Thoughts are lucid without loosening of associations or delusions. Denies hallucinations. Denies suicidal or homicidal ideation. Insight and judgment long-term, especially when she is intoxicated, are poor but right now they are improved. Intelligence normal. Short and long-term memory currently intact except for the moment when she was actually intoxicated, she blacked out for those. Fund of knowledge normal.   DISPOSITION: The patient will be discharged home to her family with followup with [facility]. The team there is already aware of it and is working on followup plans with her and she has been seen by the representative here in the hospital.   DIAGNOSIS, PRINCIPAL AND PRIMARY:  AXIS I: Mood disorder secondary to intoxication with hypnotic, now resolved.   SECONDARY DIAGNOSES: AXIS I:  1.  Depression, not otherwise specified.  2.  Polysubstance dependence.  AXIS II: Borderline personality disorder.  AXIS III: Hypothyroidism.  AXIS IV: Severe from chronic mental illness and disruptive and unsupportive environment in her home.  AXIS V: Functioning at time of discharge 55.   ____________________________ Gonzella Lex, MD jtc:cs D: 01/27/2014 14:16:58 ET T: 01/27/2014  20:11:05 ET JOB#: [id number]  cc: Gonzella Lex, MD, <Dictator> Gonzella Lex MD ELECTRONICALLY SIGNED 01/27/2014 23:47
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
## 2015-02-27 NOTE — Consult Note (Signed)
Brief Consult Note: Diagnosis: depression.   Patient was seen by consultant.   Recommend further assessment or treatment.   Orders entered.   Comments: Psychiatry: Admit to psychiatry inpatient. See full H&P.  Electronic Signatures: Clapacs, Madie Reno (MD)  (Signed 05-Apr-13 13:18)  Authored: Brief Consult Note   Last Updated: 05-Apr-13 13:18 by Gonzella Lex (MD)

## 2015-02-27 NOTE — Discharge Summary (Signed)
PATIENT NAME:  Kelsey Maldonado, Kelsey Maldonado MR#:  [id number] DATE OF BIRTH:  [birth year]  DATE OF ADMISSION:  02/08/2012 DATE OF DISCHARGE:  02/15/2012  HOSPITAL COURSE: See dictated history and physical for details of admission. This 34 year old woman with a history of depression and anxiety was admitted to the hospital through the Emergency Room because of reports of worsening depression and anxiety, passive suicidal ideation, feeling out of control, concern about ongoing prescription drug abuse. In the hospital, the patient presented as depressed and anxious. She was started back on medication based on her prior history. She has tolerated medication well without any significant side effects. She attended groups appropriately and has interacted appropriately in therapy. Gradually she has shown an improvement in her mood. She had a family meeting which she found very helpful because she was holding onto a lot of guilt about her relationship with her family. For a day or two she reported having some suicidal thoughts because of her increased guilt but those have now improved. At no point has she engaged in any dangerous or aggressive activity. She is completely denying any suicidal ideation at this point. Patient has been managed as best we can without any abusable drugs. She has been given hydroxyzine for anxiety which she says she finds extremely helpful. We will be continuing this medicine along with her current psych medications at discharge. Patient is strongly encouraged to stay off of all substances including abusing prescription drugs. She is to follow up with [facility] and to follow up with [facility] for medication management. She will be going back to stay with her family.   LABORATORY, DIAGNOSTIC AND RADIOLOGICAL DATA: Admission labs were positive for MDMA and benzodiazepines. TSH was elevated at 17.9 because she had been off her thyroid medicine. Alcohol level undetectable. Chemistry showed a chloride  elevated at 109. CBC was normal. Pregnancy test negative. Salicylates and acetaminophen undetectable. Follow-up lipid panel showed a cholesterol of 127, triglycerides 189, HDL 33, VLDL 38 and LDL 56. The only abnormality there is the HDL which is on the low side.   DISCHARGE MEDICATIONS:  1. Synthroid 100 mcg per day.  2. Topiramate 200 mg twice a day.  3. Haloperidol 1 mg twice a day.  4. Bupropion extended-release 300 mg per day.  5. Melatonin 3 mg at bedtime.  6. Hydroxyzine 50 mg 3 times a day as needed for anxiety.  7. Trazodone 200 mg at bedtime.  8. Prozac 40 mg per day.   MENTAL STATUS EXAM: Neatly dressed and groomed woman, looks her stated age. Cooperative and pleasant. Good eye contact, normal psychomotor activity. Speech is normal in rate, tone, and volume. Affect is euthymic, reactive and upbeat. Mood is stated as good. Thoughts are lucid and directed with no evidence of loosening of associations or delusional content. Denies hallucinations. Denies any suicidal or homicidal ideation. Shows improved judgment and insight.   DIAGNOSIS PRINCIPLE AND PRIMARY:  AXIS I: Depression, not otherwise specified.   SECONDARY DIAGNOSES:  AXIS I:  1. Anxiety disorder, not otherwise specified. 2. Polysubstance dependence including prescription benzodiazepines and amphetamines.   AXIS II: Borderline traits noted.   AXIS III: Hypothyroid.   AXIS IV: Moderate to severe stress from difficulty with her family and limited resources.   AXIS V: Functioning at time of discharge 110.   ____________________________ Gonzella Lex, MD jtc:cms D: 02/15/2012 11:09:17 ET T: 02/17/2012 16:22:05 ET JOB#: [id number]  cc: Gonzella Lex, MD, <Dictator> Gonzella Lex MD ELECTRONICALLY SIGNED 02/18/2012 10:02
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
## 2015-03-06 NOTE — Consult Note (Signed)
Psychiatry: Follow-up for this patient with personality disorder and depression NOS and substance abuse.  Patient was informed that we do not have any beds available for admission but she could remain in the emergency room for treatment while we wait for a bed.  At that she abruptly changed her presentation.  She now states that she has no suicidal intent or plan.  States that if released she will go with her mother and consider presenting to the [facility] emergency room.  He is able to articulate a clear plan for the future without any immediate wish to harm herself.  Thoughts appear to be lucid. discussed with emergency room Dr.  At this point I don't think there is any benefit to commitment.  Patient is not acutely intoxicated as far as I can tell and is lucid with her behavior.  Does not appear to be an acute risk to go harm herself. counseling and education done with the patient.  She can be released from the emergency room. Depression NOS, borderline personality disorder, polysubstance abuse  Electronic Signatures: Massie Cogliano, Madie Reno (MD)  (Signed on 25-Mar-16 17:48)  Authored  Last Updated: 25-Mar-16 17:48 by Gonzella Lex (MD)

## 2015-03-06 NOTE — Consult Note (Signed)
PATIENT NAME:  Kelsey Maldonado, Kelsey Maldonado MR#:  [id number] DATE OF BIRTH:  1981-07-25  DATE OF CONSULTATION:  01/28/2015  REFERRING PHYSICIAN:   CONSULTING PHYSICIAN:  Gonzella Lex, MD  IDENTIFYING INFORMATION AND REASON FOR CONSULTATION: A 34 year old woman with a history of chronic mood instability, comes in to the Emergency Room voluntarily.   CHIEF COMPLAINT: "Suicidal thoughts and rage."   HISTORY OF PRESENT ILLNESS: The patient states that she has been going through a lot of stress recently and is having outbursts of rage and having active suicidal thoughts. She says that her mood feels unstable all the time. She has been throwing tantrums at home. She started having thoughts about overdosing on her medicine, but did not act on it. She is sleeping poorly. She describes the stress that she is under as being uncomfortable being around her stepfather, whom she claims is "in love" with her. Also that she does not get along with her mother. The patient describes herself as feeling paranoid, but denies any auditory hallucinations. She has been compliant with her outpatient medicines prescribed by [facility]. She has also managed to convince a doctor to give her standing high doses of narcotics.   PAST PSYCHIATRIC HISTORY: The patient has had several hospitalizations in the past, but the most recent one as far as we know was a year ago. She has been followed at [facility] as an outpatient. She has a history of behavior consistent with borderline personality disorder and substance abuse. She has had serious suicide attempts by overdose and also irresponsible overdoses in the past that have resulted in hospitalization.   SOCIAL HISTORY:  The patient lives with her mother and stepfather and other extended family members. Not currently working.   PAST MEDICAL HISTORY: Hypothyroidism.   SUBSTANCE ABUSE HISTORY: History of polysubstance abuse especially of multiple prescription drugs.   FAMILY HISTORY: Positive for a mother  who also has mood instability.  REVIEW OF SYSTEMS: Describes having (Dictation Anomaly)<<angry, intrusiveMISSING TEXT>> thoughts and rage. Has a history of outbursts. Says that she is having suicidal ideation. Denies hallucinations. Denies any specific physical symptoms.   MENTAL STATUS EXAMINATION: This is a 34 year old woman who appears in no acute distress. Mildly disheveled. Eye contact good. Psychomotor activity a little fidgety, not extreme. Speech is normal in rate, tone, and volume. Affect is somewhat histrionic, but normal in rate and tone. Mood is stated as being suicidal and rageful. Thoughts are a little scattered, not bizarre. No obvious delusions. Denies auditory or visual hallucinations. Endorses suicidal thoughts, also says she has homicidal thoughts towards her stepfather. She is alert and oriented x 4. Can repeat 3 words immediately, remembers only 1 of them at 3 minutes. Judgment and insight poor. Fund of knowledge normal, baseline intelligence normal.   CURRENT MEDICATIONS: Prozac 40 mg a day, Remeron 30 mg at night, Ambien 20 mg at night, Topamax 200 mg twice a day, Wellbutrin XL 300 mg a day, Synthroid 150 mcg a day, also appears to be taking hydrocodone 10 mg 6 pills a day, and phentermine 37.5 mg twice a day.   LABORATORY RESULTS: Urinalysis unremarkable. CBC normal. Chemistry panel unremarkable. Alcohol and acetaminophen negative. Pregnancy test negative. Do not have a drug screen back yet.   VITAL SIGNS: Blood pressure 128/75, respirations 18, pulse 93, temperature 98.7.   ASSESSMENT: A 34 year old woman with mood instability. Currently states that she is definitely having suicidal and homicidal thoughts. History of personality disorder and impulsivity. There are no beds available on the  psychiatry ward right now.   TREATMENT PLAN: We could admit her to the hospital, but we have no beds available right now. We can work on out referral to other hospitals. Continue her usual  psychiatric medicines for now. This patient has a serious history of substance abuse and has no reason to be on the phentermine or the hydrocodone, both of which will be discontinued.   DIAGNOSIS PRINCIPAL AND PRIMARY:   AXIS I: Depression not otherwise specified.   SECONDARY DIAGNOSES:   AXIS I:  1.  Polysubstance dependence.  2.  Borderline personality disorder.   AXIS III:  1.  Hypothyroidism.  2.  History of endometriosis.     ____________________________ Gonzella Lex, MD jtc:bu D: 01/28/2015 16:01:36 ET T: 01/28/2015 16:22:00 ET JOB#: [id number]  cc: Gonzella Lex, MD, <Dictator> Gonzella Lex MD ELECTRONICALLY SIGNED 02/07/2015 9:58

## 2015-03-17 ENCOUNTER — Emergency Department (HOSPITAL_COMMUNITY): Payer: Medicare Other

## 2015-03-17 ENCOUNTER — Emergency Department (HOSPITAL_COMMUNITY)
Admission: EM | Admit: 2015-03-17 | Discharge: 2015-03-17 | Disposition: A | Discharge status: Home / Self Care | Payer: Medicare Other | Attending: Emergency Medicine | Admitting: Emergency Medicine

## 2015-03-17 ENCOUNTER — Encounter (HOSPITAL_COMMUNITY): Payer: Self-pay | Admitting: Emergency Medicine

## 2015-03-17 DIAGNOSIS — D259 Leiomyoma of uterus, unspecified: Secondary | ICD-10-CM | POA: Diagnosis not present

## 2015-03-17 DIAGNOSIS — Z3202 Encounter for pregnancy test, result negative: Secondary | ICD-10-CM | POA: Diagnosis not present

## 2015-03-17 DIAGNOSIS — E039 Hypothyroidism, unspecified: Secondary | ICD-10-CM | POA: Insufficient documentation

## 2015-03-17 DIAGNOSIS — R1031 Right lower quadrant pain: Secondary | ICD-10-CM

## 2015-03-17 DIAGNOSIS — F319 Bipolar disorder, unspecified: Secondary | ICD-10-CM | POA: Insufficient documentation

## 2015-03-17 DIAGNOSIS — Z720 Tobacco use: Secondary | ICD-10-CM | POA: Diagnosis not present

## 2015-03-17 DIAGNOSIS — R102 Pelvic and perineal pain: Secondary | ICD-10-CM

## 2015-03-17 DIAGNOSIS — Z79899 Other long term (current) drug therapy: Secondary | ICD-10-CM | POA: Insufficient documentation

## 2015-03-17 LAB — CBC WITH DIFFERENTIAL/PLATELET
Basophils Absolute: 0 10*3/uL (ref 0.0–0.1)
Basophils Relative: 1 % (ref 0–1)
EOS ABS: 0.5 10*3/uL (ref 0.0–0.7)
Eosinophils Relative: 8 % — ABNORMAL HIGH (ref 0–5)
HEMATOCRIT: 43.8 % (ref 36.0–46.0)
Hemoglobin: 14.6 g/dL (ref 12.0–15.0)
Lymphocytes Relative: 38 % (ref 12–46)
Lymphs Abs: 2.2 10*3/uL (ref 0.7–4.0)
MCH: 30.2 pg (ref 26.0–34.0)
MCHC: 33.3 g/dL (ref 30.0–36.0)
MCV: 90.7 fL (ref 78.0–100.0)
MONO ABS: 0.5 10*3/uL (ref 0.1–1.0)
Monocytes Relative: 9 % (ref 3–12)
Neutro Abs: 2.6 10*3/uL (ref 1.7–7.7)
Neutrophils Relative %: 44 % (ref 43–77)
PLATELETS: 215 10*3/uL (ref 150–400)
RBC: 4.83 MIL/uL (ref 3.87–5.11)
RDW: 13 % (ref 11.5–15.5)
WBC: 5.7 10*3/uL (ref 4.0–10.5)

## 2015-03-17 LAB — COMPREHENSIVE METABOLIC PANEL
ALT: 15 U/L (ref 14–54)
AST: 14 U/L — AB (ref 15–41)
Albumin: 3.9 g/dL (ref 3.5–5.0)
Alkaline Phosphatase: 58 U/L (ref 38–126)
Anion gap: 7 (ref 5–15)
BUN: 14 mg/dL (ref 6–20)
CHLORIDE: 108 mmol/L (ref 101–111)
CO2: 24 mmol/L (ref 22–32)
Calcium: 9 mg/dL (ref 8.9–10.3)
Creatinine, Ser: 0.97 mg/dL (ref 0.44–1.00)
GFR calc Af Amer: >60 mL/min (ref >60)
GFR calc non Af Amer: >60 mL/min (ref >60)
Glucose, Bld: 75 mg/dL (ref 65–99)
Potassium: 3.9 mmol/L (ref 3.5–5.1)
Sodium: 139 mmol/L (ref 135–145)
TOTAL PROTEIN: 7.1 g/dL (ref 6.5–8.1)
Total Bilirubin: 0.5 mg/dL (ref 0.3–1.2)

## 2015-03-17 LAB — URINALYSIS, ROUTINE W REFLEX MICROSCOPIC
BILIRUBIN URINE: NEGATIVE
GLUCOSE, UA: NEGATIVE mg/dL
Hgb urine dipstick: NEGATIVE
KETONES UR: NEGATIVE mg/dL
Leukocytes, UA: NEGATIVE
NITRITE: NEGATIVE
PH: 5.5 (ref 5.0–8.0)
Protein, ur: NEGATIVE mg/dL
Specific Gravity, Urine: 1.013 (ref 1.005–1.030)
UROBILINOGEN UA: 0.2 mg/dL (ref 0.0–1.0)

## 2015-03-17 LAB — WET PREP, GENITAL
CLUE CELLS WET PREP: NONE SEEN
Trich, Wet Prep: NONE SEEN
Yeast Wet Prep HPF POC: NONE SEEN

## 2015-03-17 LAB — POC URINE PREG, ED: Preg Test, Ur: NEGATIVE

## 2015-03-17 MED ORDER — SODIUM CHLORIDE 0.9 % IV BOLUS (SEPSIS)
1000.0000 mL | Freq: Once | INTRAVENOUS | Status: AC
  Administered 2015-03-17: 1000 mL via INTRAVENOUS

## 2015-03-17 MED ORDER — MORPHINE SULFATE 4 MG/ML IJ SOLN
4.0000 mg | Freq: Once | INTRAMUSCULAR | Status: AC
  Administered 2015-03-17: 4 mg via INTRAVENOUS
  Filled 2015-03-17: qty 1

## 2015-03-17 MED ORDER — ONDANSETRON HCL 4 MG/2ML IJ SOLN
4.0000 mg | Freq: Once | INTRAMUSCULAR | Status: AC
  Administered 2015-03-17: 4 mg via INTRAVENOUS
  Filled 2015-03-17: qty 2

## 2015-03-17 MED ORDER — IOHEXOL 300 MG/ML  SOLN
25.0000 mL | Freq: Once | INTRAMUSCULAR | Status: AC | PRN
  Administered 2015-03-17: 25 mL via ORAL

## 2015-03-17 MED ORDER — FENTANYL CITRATE (PF) 100 MCG/2ML IJ SOLN
100.0000 ug | Freq: Once | INTRAMUSCULAR | Status: AC
  Administered 2015-03-17: 100 ug via INTRAVENOUS
  Filled 2015-03-17: qty 2

## 2015-03-17 MED ORDER — OXYCODONE-ACETAMINOPHEN 5-325 MG PO TABS: 1.0000 | ORAL_TABLET | Freq: Four times a day (QID) | ORAL | Status: DC | PRN

## 2015-03-17 MED ORDER — IOHEXOL 300 MG/ML  SOLN
100.0000 mL | Freq: Once | INTRAMUSCULAR | Status: AC | PRN
  Administered 2015-03-17: 100 mL via INTRAVENOUS

## 2015-03-17 MED ORDER — ONDANSETRON HCL 4 MG PO TABS: 4.0000 mg | ORAL_TABLET | Freq: Four times a day (QID) | ORAL | Status: DC

## 2015-03-17 NOTE — ED Notes (Signed)
Pt. Is going to Ultrasound.

## 2015-03-17 NOTE — ED Notes (Signed)
Pt. Is going to CT. 

## 2015-03-17 NOTE — ED Notes (Addendum)
RLQ starting yesterday; tender to palpation and worse with inspiration; diarr; nausea. Denies urinary symptoms; hx of stones and does not feel like kidney stones; hx of ovarian cysts but pain is worse. Pt was unable to sleep due to pain.

## 2015-03-17 NOTE — ED Provider Notes (Signed)
CSN: [id number]     Arrival date & time 03/17/15  1018 History   First MD Initiated Contact with Patient 03/17/15 1019     Chief Complaint  Patient presents with  . Abdominal Pain     (Consider location/radiation/quality/duration/timing/severity/associated sxs/prior Treatment) HPI Comments: Patient presents today with a chief complaint of RLQ abdominal pain.  She states that the pain has been constant since yesterday afternoon.  Pain does not radiate.  She has not taken anything for pain prior to arrival.  She reports a history of Ovarian Cyst and also kidney stones, but states that this pain feels different.  She reports associated nausea and diarrhea.  She denies vomiting, fever, chills, urinary symptoms, vaginal bleeding, or vaginal discharge.  LMP was the end of April, which she reports was normal.    Patient is a 34 y.o. female presenting with abdominal pain. The history is provided by the patient.  Abdominal Pain   Past Medical History  Diagnosis Date  . Thyroid disease   . Bipolar 1 disorder   . Hypothyroidism   . Depression   . Endometriosis    Past Surgical History  Procedure Laterality Date  . Molar pregnancy     History reviewed. No pertinent family history. History  Substance Use Topics  . Smoking status: Current Every Day Smoker -- 1.50 packs/day for 12 years    Types: Cigarettes  . Smokeless tobacco: Not on file  . Alcohol Use: No   OB History    No data available     Review of Systems  Gastrointestinal: Positive for abdominal pain.  All other systems reviewed and are negative.     Allergies  Trazodone and nefazodone; Hydrocodone; Tomato; Toradol; and Tramadol  Home Medications   Prior to Admission medications   Medication Sig Start Date End Date Taking? Authorizing Provider  FLUoxetine (PROZAC) 40 MG capsule Take 1 capsule (40 mg total) by mouth daily. 06/08/13   Ruben Im, PA-C  hydrOXYzine (ATARAX/VISTARIL) 50 MG tablet Take one tablet 3 x a  day and then 1 at bedtime. 06/08/13   Ruben Im, PA-C  lamoTRIgine (LAMICTAL) 25 MG tablet Take 2 tablets (50 mg total) by mouth 2 (two) times daily. 06/08/13   Ruben Im, PA-C  levothyroxine (SYNTHROID, LEVOTHROID) 112 MCG tablet Take 112 mcg by mouth daily.    Historical Provider, MD  ramelteon (ROZEREM) 8 MG tablet Take 1 tablet (8 mg total) by mouth at bedtime. 06/08/13   Ruben Im, PA-C  topiramate (TOPAMAX) 200 MG tablet Take 1 tablet (200 mg total) by mouth 2 (two) times daily. 06/08/13   Ruben Im, PA-C   BP 117/71 mmHg  Pulse 63  Temp(Src) 97.9 F (36.6 C) (Oral)  Resp 18  SpO2 100%  LMP 02/28/2015 Physical Exam  Constitutional: She appears well-developed and well-nourished.  HENT:  Head: Normocephalic and atraumatic.  Mouth/Throat: Oropharynx is clear and moist.  Neck: Normal range of motion. Neck supple.  Cardiovascular: Normal rate, regular rhythm and normal heart sounds.   Pulmonary/Chest: Effort normal and breath sounds normal.  Abdominal: Soft. Bowel sounds are normal. She exhibits no distension and no mass. There is tenderness in the right lower quadrant. There is tenderness at McBurney's point. There is no rigidity, no rebound, no guarding and no CVA tenderness.  Positive Rovsing's sign  Genitourinary: Cervix exhibits no motion tenderness and no discharge. Right adnexum displays tenderness. Right adnexum displays no mass and no fullness. Left adnexum displays no  mass, no tenderness and no fullness.  Musculoskeletal: Normal range of motion.  Neurological: She is alert.  Skin: Skin is warm and dry.  Psychiatric: She has a normal mood and affect.  Nursing note and vitals reviewed.   ED Course  Procedures (including critical care time) Labs Review Labs Reviewed  CBC WITH DIFFERENTIAL/PLATELET  COMPREHENSIVE METABOLIC PANEL  URINALYSIS, ROUTINE W REFLEX MICROSCOPIC  POC URINE PREG, ED    Imaging Review US Transvaginal Non-ob  03/17/2015    CLINICAL DATA:  Abdominal pain and right adnexal pain.  EXAM: TRANSABDOMINAL AND TRANSVAGINAL ULTRASOUND OF PELVIS  DOPPLER ULTRASOUND OF OVARIES  TECHNIQUE: Both transabdominal and transvaginal ultrasound examinations of the pelvis were performed. Transabdominal technique was performed for global imaging of the pelvis including uterus, ovaries, adnexal regions, and pelvic cul-de-sac.  It was necessary to proceed with endovaginal exam following the transabdominal exam to visualize the ovaries. Color and duplex Doppler ultrasound was utilized to evaluate blood flow to the ovaries.  COMPARISON:  CT scan of the abdomen and pelvis dated 03/17/2015  FINDINGS: Uterus  Measurements: 6.2 x 4.3 x 5.2 cm. 1.5 cm subtle fibroid in the uterine fundus.  Endometrium  Thickness: 9 mm.  No focal abnormality visualized.  Right ovary  Measurements: 2.7 x 2.5 x 3.6 cm. Normal appearance/no adnexal mass.  Left ovary  Measurements: 4.5 x 2.3 x 2.6 cm. Normal appearance/no adnexal mass.  Pulsed Doppler evaluation of both ovaries demonstrates normal low-resistance arterial and venous waveforms.  Other findings  No free fluid.  IMPRESSION: Small fibroid in the uterine fundus. Otherwise, normal exam. Specifically, normal perfusion to the ovaries.   Electronically Signed   By: Lorriane Shire M.D.   On: 03/17/2015 15:02   US Pelvis Complete  03/17/2015   CLINICAL DATA:  Abdominal pain and right adnexal pain.  EXAM: TRANSABDOMINAL AND TRANSVAGINAL ULTRASOUND OF PELVIS  DOPPLER ULTRASOUND OF OVARIES  TECHNIQUE: Both transabdominal and transvaginal ultrasound examinations of the pelvis were performed. Transabdominal technique was performed for global imaging of the pelvis including uterus, ovaries, adnexal regions, and pelvic cul-de-sac.  It was necessary to proceed with endovaginal exam following the transabdominal exam to visualize the ovaries. Color and duplex Doppler ultrasound was utilized to evaluate blood flow to the ovaries.   COMPARISON:  CT scan of the abdomen and pelvis dated 03/17/2015  FINDINGS: Uterus  Measurements: 6.2 x 4.3 x 5.2 cm. 1.5 cm subtle fibroid in the uterine fundus.  Endometrium  Thickness: 9 mm.  No focal abnormality visualized.  Right ovary  Measurements: 2.7 x 2.5 x 3.6 cm. Normal appearance/no adnexal mass.  Left ovary  Measurements: 4.5 x 2.3 x 2.6 cm. Normal appearance/no adnexal mass.  Pulsed Doppler evaluation of both ovaries demonstrates normal low-resistance arterial and venous waveforms.  Other findings  No free fluid.  IMPRESSION: Small fibroid in the uterine fundus. Otherwise, normal exam. Specifically, normal perfusion to the ovaries.   Electronically Signed   By: Lorriane Shire M.D.   On: 03/17/2015 15:02   Ct Abdomen Pelvis W Contrast  03/17/2015   CLINICAL DATA:  Right lower quadrant pain  EXAM: CT ABDOMEN AND PELVIS WITH CONTRAST  TECHNIQUE: Multidetector CT imaging of the abdomen and pelvis was performed using the standard protocol following bolus administration of intravenous contrast.  CONTRAST:  113mL OMNIPAQUE IOHEXOL 300 MG/ML  SOLN  COMPARISON:  CT abdomen pelvis 10/14/2013  FINDINGS: Lung bases are clear.  No infiltrate or effusion.  Liver is homogeneous without focal lesion. Gallbladder and  bile ducts normal. Pancreas and spleen normal  Kidneys are normal. No renal mass or obstruction or stone. Urinary bladder normal  Retroflexed uterus. 4 cm fundal fibroid is unchanged. Ovaries are normal bilaterally.  Negative for bowel obstruction. No bowel edema or mass. Normal appendix. Mild amount of stool in the colon.  Negative for adenopathy or mass.  No free fluid.  IMPRESSION: Normal appendix  4 cm uterine fundal fibroid   Electronically Signed   By: Franchot Gallo M.D.   On: 03/17/2015 13:01   [place] Art/ven Flow Abd Pelv Doppler  03/17/2015   CLINICAL DATA:  Abdominal pain and right adnexal pain.  EXAM: TRANSABDOMINAL AND TRANSVAGINAL ULTRASOUND OF PELVIS  DOPPLER ULTRASOUND OF OVARIES   TECHNIQUE: Both transabdominal and transvaginal ultrasound examinations of the pelvis were performed. Transabdominal technique was performed for global imaging of the pelvis including uterus, ovaries, adnexal regions, and pelvic cul-de-sac.  It was necessary to proceed with endovaginal exam following the transabdominal exam to visualize the ovaries. Color and duplex Doppler ultrasound was utilized to evaluate blood flow to the ovaries.  COMPARISON:  CT scan of the abdomen and pelvis dated 03/17/2015  FINDINGS: Uterus  Measurements: 6.2 x 4.3 x 5.2 cm. 1.5 cm subtle fibroid in the uterine fundus.  Endometrium  Thickness: 9 mm.  No focal abnormality visualized.  Right ovary  Measurements: 2.7 x 2.5 x 3.6 cm. Normal appearance/no adnexal mass.  Left ovary  Measurements: 4.5 x 2.3 x 2.6 cm. Normal appearance/no adnexal mass.  Pulsed Doppler evaluation of both ovaries demonstrates normal low-resistance arterial and venous waveforms.  Other findings  No free fluid.  IMPRESSION: Small fibroid in the uterine fundus. Otherwise, normal exam. Specifically, normal perfusion to the ovaries.   Electronically Signed   By: Lorriane Shire M.D.   On: 03/17/2015 15:02     EKG Interpretation None      MDM   Final diagnoses:  None   Patient presents today with RLQ abdominal pain that has been constant since yesterday morning.  No rebound or guarding.  Labs today unremarkable.  Urine pregnancy negative.  UA negative.  Mild adnexal tenderness on pelvic exam, but no CMT.  CT ab/pelvis negative aside from a Uterine Fibroid.  Pelvic ultrasound showing no evidence of Ovarian Torsion and also negative aside from the 4 cm fibroid.  Patient states that she has an appointment with OB/GYN scheduled next week.  Pain and nausea controlled in the ED.  Patient tolerating PO liquids and eating crackers.  Feel that the patient is stable for discharge.  Return precautions given.      Hyman Bible, PA-C 03/17/15 1629  Evelina Bucy,  MD 03/17/15 [phone]

## 2015-03-18 LAB — GC/CHLAMYDIA PROBE AMP (~~LOC~~) NOT AT ARMC
Chlamydia: NEGATIVE
NEISSERIA GONORRHEA: NEGATIVE

## 2015-04-19 ENCOUNTER — Other Ambulatory Visit: Payer: Self-pay

## 2015-04-21 ENCOUNTER — Encounter
Admission: RE | Admit: 2015-04-21 | Discharge: 2015-04-21 | Disposition: A | Discharge status: Home / Self Care | Payer: Medicare Other | Source: Ambulatory Visit | Attending: Obstetrics and Gynecology | Admitting: Obstetrics and Gynecology

## 2015-04-21 DIAGNOSIS — Z01812 Encounter for preprocedural laboratory examination: Secondary | ICD-10-CM | POA: Diagnosis not present

## 2015-04-21 DIAGNOSIS — R102 Pelvic and perineal pain: Secondary | ICD-10-CM | POA: Insufficient documentation

## 2015-04-21 HISTORY — DX: Other chronic pain: G89.29

## 2015-04-21 HISTORY — DX: Anogenital (venereal) warts: A63.0

## 2015-04-21 NOTE — Patient Instructions (Signed)
  Your procedure is scheduled on: Wed 04/27/15 Report to Day Surgery. To find out your arrival time please call [phone] between 1PM - 3PM on Tues 04/26/15  Remember: Instructions that are not followed completely may result in serious medical risk, up to and including death, or upon the discretion of your surgeon and anesthesiologist your surgery may need to be rescheduled.    _x___ 1. Do not eat food or drink liquids after midnight. No gum chewing or hard candies.     ____ 2. No Alcohol for 24 hours before or after surgery.   ____ 3. Bring all medications with you on the day of surgery if instructed.    _x___ 4. Notify your doctor if there is any change in your medical condition     (cold, fever, infections).     Do not wear jewelry, make-up, hairpins, clips or nail polish.  Do not wear lotions, powders, or perfumes. You may wear deodorant.  Do not shave 48 hours prior to surgery. Men may shave face and neck.  Do not bring valuables to the hospital.    [facility] is not responsible for any belongings or valuables.               Contacts, dentures or bridgework may not be worn into surgery.  Leave your suitcase in the car. After surgery it may be brought to your room.  For patients admitted to the hospital, discharge time is determined by your                treatment team.   Patients discharged the day of surgery will not be allowed to drive home.   Please read over the following fact sheets that you were given:      _x___ Take these medicines the morning of surgery with A SIP OF WATER:    1. FLUoxetine (PROZAC) 40 MG capsule  2. levothyroxine (SYNTHROID, LEVOTHROID) 137 MCG tablet  3. oxyCODONE-acetaminophen (PERCOCET) 7.5-325 MG per tablet  4.topiramate (TOPAMAX) 200 MG tablet  5.  6.  ____ Fleet Enema (as directed)   _x___ Use CHG Soap as directed  ____ Use inhalers on the day of surgery  ____ Stop metformin 2 days prior to surgery    ____ Take 1/2 of usual  insulin dose the night before surgery and none on the morning of surgery.   ____ Stop Coumadin/Plavix/aspirin on   ____ Stop Anti-inflammatories on    ____ Stop supplements until after surgery.    ____ Bring C-Pap to the hospital.

## 2015-04-26 ENCOUNTER — Encounter
Admission: RE | Admit: 2015-04-26 | Discharge: 2015-04-26 | Disposition: A | Discharge status: Home / Self Care | Payer: Medicare Other | Source: Ambulatory Visit | Attending: Obstetrics and Gynecology | Admitting: Obstetrics and Gynecology

## 2015-04-26 DIAGNOSIS — N879 Dysplasia of cervix uteri, unspecified: Secondary | ICD-10-CM | POA: Diagnosis not present

## 2015-04-26 DIAGNOSIS — Z885 Allergy status to narcotic agent status: Secondary | ICD-10-CM | POA: Diagnosis not present

## 2015-04-26 DIAGNOSIS — N736 Female pelvic peritoneal adhesions (postinfective): Secondary | ICD-10-CM | POA: Diagnosis not present

## 2015-04-26 DIAGNOSIS — N8500 Endometrial hyperplasia, unspecified: Secondary | ICD-10-CM | POA: Diagnosis not present

## 2015-04-26 DIAGNOSIS — G8929 Other chronic pain: Secondary | ICD-10-CM | POA: Diagnosis not present

## 2015-04-26 DIAGNOSIS — F319 Bipolar disorder, unspecified: Secondary | ICD-10-CM | POA: Diagnosis not present

## 2015-04-26 DIAGNOSIS — N72 Inflammatory disease of cervix uteri: Secondary | ICD-10-CM | POA: Diagnosis not present

## 2015-04-26 DIAGNOSIS — Z888 Allergy status to other drugs, medicaments and biological substances status: Secondary | ICD-10-CM | POA: Diagnosis not present

## 2015-04-26 DIAGNOSIS — E039 Hypothyroidism, unspecified: Secondary | ICD-10-CM | POA: Diagnosis not present

## 2015-04-26 DIAGNOSIS — R102 Pelvic and perineal pain: Secondary | ICD-10-CM | POA: Diagnosis present

## 2015-04-26 DIAGNOSIS — Z91018 Allergy to other foods: Secondary | ICD-10-CM | POA: Diagnosis not present

## 2015-04-26 LAB — BASIC METABOLIC PANEL
Anion gap: 8 (ref 5–15)
BUN: 13 mg/dL (ref 6–20)
CHLORIDE: 110 mmol/L (ref 101–111)
CO2: 24 mmol/L (ref 22–32)
CREATININE: 0.77 mg/dL (ref 0.44–1.00)
Calcium: 9.2 mg/dL (ref 8.9–10.3)
GFR calc non Af Amer: >60 mL/min (ref >60)
Glucose, Bld: 90 mg/dL (ref 65–99)
POTASSIUM: 3.9 mmol/L (ref 3.5–5.1)
SODIUM: 142 mmol/L (ref 135–145)

## 2015-04-26 LAB — CBC
HCT: 44.1 % (ref 35.0–47.0)
Hemoglobin: 14.4 g/dL (ref 12.0–16.0)
MCH: 30.1 pg (ref 26.0–34.0)
MCHC: 32.7 g/dL (ref 32.0–36.0)
MCV: 92.2 fL (ref 80.0–100.0)
Platelets: 248 10*3/uL (ref 150–440)
RBC: 4.78 MIL/uL (ref 3.80–5.20)
RDW: 13.5 % (ref 11.5–14.5)
WBC: 6.4 10*3/uL (ref 3.6–11.0)

## 2015-04-26 LAB — TYPE AND SCREEN
ABO/RH(D): A POS
ANTIBODY SCREEN: NEGATIVE

## 2015-04-26 LAB — ABO/RH: ABO/RH(D): A POS

## 2015-04-26 LAB — PREGNANCY, URINE: Preg Test, Ur: NEGATIVE

## 2015-04-26 NOTE — OR Nursing (Signed)
No orders on arrival to PAT office called and faxed on 04/21/15. Fax status OK on 04/21/15 @ 12:29.

## 2015-04-27 ENCOUNTER — Ambulatory Visit: Payer: Medicare Other | Admitting: *Deleted

## 2015-04-27 ENCOUNTER — Encounter
Admission: RE | Disposition: A | Discharge status: Home / Self Care | Payer: Self-pay | Source: Ambulatory Visit | Attending: Obstetrics and Gynecology

## 2015-04-27 ENCOUNTER — Observation Stay
Admission: RE | Admit: 2015-04-27 | Discharge: 2015-04-28 | Disposition: A | Discharge status: Home / Self Care | Payer: Medicare Other | Source: Ambulatory Visit | Attending: Obstetrics and Gynecology | Admitting: Obstetrics and Gynecology

## 2015-04-27 ENCOUNTER — Encounter: Payer: Self-pay | Admitting: *Deleted

## 2015-04-27 DIAGNOSIS — N8500 Endometrial hyperplasia, unspecified: Secondary | ICD-10-CM | POA: Insufficient documentation

## 2015-04-27 DIAGNOSIS — N72 Inflammatory disease of cervix uteri: Secondary | ICD-10-CM | POA: Insufficient documentation

## 2015-04-27 DIAGNOSIS — Z91018 Allergy to other foods: Secondary | ICD-10-CM | POA: Insufficient documentation

## 2015-04-27 DIAGNOSIS — G8929 Other chronic pain: Secondary | ICD-10-CM | POA: Insufficient documentation

## 2015-04-27 DIAGNOSIS — Z888 Allergy status to other drugs, medicaments and biological substances status: Secondary | ICD-10-CM | POA: Insufficient documentation

## 2015-04-27 DIAGNOSIS — N879 Dysplasia of cervix uteri, unspecified: Principal | ICD-10-CM | POA: Insufficient documentation

## 2015-04-27 DIAGNOSIS — N736 Female pelvic peritoneal adhesions (postinfective): Secondary | ICD-10-CM | POA: Insufficient documentation

## 2015-04-27 DIAGNOSIS — Z885 Allergy status to narcotic agent status: Secondary | ICD-10-CM | POA: Insufficient documentation

## 2015-04-27 DIAGNOSIS — R102 Pelvic and perineal pain: Secondary | ICD-10-CM | POA: Insufficient documentation

## 2015-04-27 DIAGNOSIS — F319 Bipolar disorder, unspecified: Secondary | ICD-10-CM | POA: Insufficient documentation

## 2015-04-27 DIAGNOSIS — E039 Hypothyroidism, unspecified: Secondary | ICD-10-CM | POA: Insufficient documentation

## 2015-04-27 HISTORY — PX: LAPAROSCOPIC HYSTERECTOMY: SHX1926

## 2015-04-27 HISTORY — PX: LYSIS OF ADHESION: SHX5961

## 2015-04-27 SURGERY — HYSTERECTOMY, TOTAL, LAPAROSCOPIC
Anesthesia: General

## 2015-04-27 MED ORDER — NICOTINE 21 MG/24HR TD PT24
21.0000 mg | MEDICATED_PATCH | Freq: Every day | TRANSDERMAL | Status: DC
  Administered 2015-04-27: 21 mg via TRANSDERMAL
  Filled 2015-04-27: qty 1

## 2015-04-27 MED ORDER — LAMOTRIGINE 100 MG PO TABS
50.0000 mg | ORAL_TABLET | Freq: Two times a day (BID) | ORAL | Status: DC
  Filled 2015-04-27: qty 1

## 2015-04-27 MED ORDER — MIDAZOLAM HCL 2 MG/2ML IJ SOLN
INTRAMUSCULAR | Status: DC | PRN
  Administered 2015-04-27 (×2): 2 mg via INTRAVENOUS

## 2015-04-27 MED ORDER — FLUOXETINE HCL 20 MG PO CAPS
80.0000 mg | ORAL_CAPSULE | Freq: Every day | ORAL | Status: DC
  Administered 2015-04-27 – 2015-04-28 (×2): 80 mg via ORAL
  Filled 2015-04-27 (×3): qty 4

## 2015-04-27 MED ORDER — CEFAZOLIN SODIUM-DEXTROSE 2-3 GM-% IV SOLR
2.0000 g | INTRAVENOUS | Status: AC
  Administered 2015-04-27: 2 g via INTRAVENOUS

## 2015-04-27 MED ORDER — BELLADONNA ALKALOIDS-OPIUM 16.2-60 MG RE SUPP
RECTAL | Status: AC
  Filled 2015-04-27: qty 1

## 2015-04-27 MED ORDER — LEVOTHYROXINE SODIUM 137 MCG PO TABS
137.0000 ug | ORAL_TABLET | Freq: Every day | ORAL | Status: DC
  Administered 2015-04-28: 137 ug via ORAL
  Filled 2015-04-27 (×2): qty 1

## 2015-04-27 MED ORDER — FAMOTIDINE 20 MG PO TABS
ORAL_TABLET | ORAL | Status: AC
  Administered 2015-04-27: 20 mg via ORAL
  Filled 2015-04-27: qty 1

## 2015-04-27 MED ORDER — FAMOTIDINE 20 MG PO TABS
20.0000 mg | ORAL_TABLET | Freq: Once | ORAL | Status: AC
  Administered 2015-04-27: 20 mg via ORAL

## 2015-04-27 MED ORDER — ACETAMINOPHEN 10 MG/ML IV SOLN
INTRAVENOUS | Status: AC
  Filled 2015-04-27: qty 100

## 2015-04-27 MED ORDER — ROCURONIUM BROMIDE 100 MG/10ML IV SOLN
INTRAVENOUS | Status: DC | PRN
  Administered 2015-04-27: 30 mg via INTRAVENOUS
  Administered 2015-04-27: 50 mg via INTRAVENOUS
  Administered 2015-04-27: 10 mg via INTRAVENOUS

## 2015-04-27 MED ORDER — SODIUM CHLORIDE 0.9 % IV SOLN: 0.5000 mg/h | INTRAVENOUS | Status: DC

## 2015-04-27 MED ORDER — PROPOFOL 10 MG/ML IV BOLUS
INTRAVENOUS | Status: DC | PRN
  Administered 2015-04-27: 130 mg via INTRAVENOUS

## 2015-04-27 MED ORDER — FENTANYL CITRATE (PF) 100 MCG/2ML IJ SOLN
INTRAMUSCULAR | Status: AC
  Administered 2015-04-27: 25 ug via INTRAVENOUS
  Filled 2015-04-27: qty 2

## 2015-04-27 MED ORDER — HYDROMORPHONE HCL 1 MG/ML IJ SOLN
0.2000 mg | INTRAMUSCULAR | Status: DC | PRN
  Administered 2015-04-27 – 2015-04-28 (×3): 0.6 mg via INTRAVENOUS
  Filled 2015-04-27 (×3): qty 1

## 2015-04-27 MED ORDER — DEXAMETHASONE SODIUM PHOSPHATE 4 MG/ML IJ SOLN
INTRAMUSCULAR | Status: DC | PRN
  Administered 2015-04-27: 10 mg via INTRAVENOUS

## 2015-04-27 MED ORDER — OXYCODONE-ACETAMINOPHEN 7.5-325 MG PO TABS
2.0000 | ORAL_TABLET | Freq: Four times a day (QID) | ORAL | Status: DC | PRN
  Administered 2015-04-27 – 2015-04-28 (×3): 2 via ORAL
  Filled 2015-04-27 (×3): qty 2

## 2015-04-27 MED ORDER — BUPIVACAINE HCL 0.5 % IJ SOLN
INTRAMUSCULAR | Status: DC | PRN
  Administered 2015-04-27: 10 mL

## 2015-04-27 MED ORDER — HYDROMORPHONE HCL 1 MG/ML IJ SOLN
0.5000 mg | INTRAMUSCULAR | Status: DC | PRN
  Administered 2015-04-27: 0.5 mg via INTRAVENOUS

## 2015-04-27 MED ORDER — LACTATED RINGERS IV SOLN
INTRAVENOUS | Status: DC
  Administered 2015-04-27 – 2015-04-28 (×2): via INTRAVENOUS

## 2015-04-27 MED ORDER — RAMELTEON 8 MG PO TABS: 8.0000 mg | ORAL_TABLET | Freq: Every day | ORAL | Status: DC

## 2015-04-27 MED ORDER — ZOLPIDEM TARTRATE 5 MG PO TABS
5.0000 mg | ORAL_TABLET | Freq: Every evening | ORAL | Status: DC | PRN
  Administered 2015-04-27: 5 mg via ORAL
  Filled 2015-04-27: qty 1

## 2015-04-27 MED ORDER — BUPIVACAINE HCL (PF) 0.5 % IJ SOLN
INTRAMUSCULAR | Status: AC
  Filled 2015-04-27: qty 30

## 2015-04-27 MED ORDER — BELLADONNA ALKALOIDS-OPIUM 16.2-30 MG RE SUPP
RECTAL | Status: DC | PRN
  Administered 2015-04-27: 15 mg via RECTAL

## 2015-04-27 MED ORDER — SIMETHICONE 80 MG PO CHEW: 80.0000 mg | CHEWABLE_TABLET | Freq: Four times a day (QID) | ORAL | Status: DC | PRN

## 2015-04-27 MED ORDER — HYDROMORPHONE HCL 1 MG/ML IJ SOLN
INTRAMUSCULAR | Status: AC
  Administered 2015-04-27: 0.5 mg via INTRAVENOUS
  Filled 2015-04-27: qty 1

## 2015-04-27 MED ORDER — LACTATED RINGERS IV SOLN
INTRAVENOUS | Status: DC
  Administered 2015-04-27 (×2): via INTRAVENOUS

## 2015-04-27 MED ORDER — DOCUSATE SODIUM 100 MG PO CAPS
100.0000 mg | ORAL_CAPSULE | Freq: Two times a day (BID) | ORAL | Status: DC
  Administered 2015-04-27 – 2015-04-28 (×2): 100 mg via ORAL
  Filled 2015-04-27 (×2): qty 1

## 2015-04-27 MED ORDER — SUGAMMADEX SODIUM 200 MG/2ML IV SOLN
INTRAVENOUS | Status: DC | PRN
  Administered 2015-04-27: 150 mg via INTRAVENOUS

## 2015-04-27 MED ORDER — BELLADONNA ALKALOIDS-OPIUM 16.2-60 MG RE SUPP: 1.0000 | Freq: Three times a day (TID) | RECTAL | Status: DC | PRN

## 2015-04-27 MED ORDER — LACTATED RINGERS IR SOLN
Status: DC | PRN
  Administered 2015-04-27: 400 mL

## 2015-04-27 MED ORDER — ONDANSETRON HCL 4 MG/2ML IJ SOLN
INTRAMUSCULAR | Status: DC | PRN
  Administered 2015-04-27: 4 mg via INTRAVENOUS

## 2015-04-27 MED ORDER — ONDANSETRON HCL 4 MG PO TABS
4.0000 mg | ORAL_TABLET | Freq: Four times a day (QID) | ORAL | Status: DC
  Administered 2015-04-28: 4 mg via ORAL
  Filled 2015-04-27: qty 1

## 2015-04-27 MED ORDER — TOPIRAMATE 100 MG PO TABS
200.0000 mg | ORAL_TABLET | Freq: Two times a day (BID) | ORAL | Status: DC
  Administered 2015-04-27 – 2015-04-28 (×2): 200 mg via ORAL
  Filled 2015-04-27 (×4): qty 2

## 2015-04-27 MED ORDER — FENTANYL CITRATE (PF) 100 MCG/2ML IJ SOLN
25.0000 ug | INTRAMUSCULAR | Status: DC | PRN
  Administered 2015-04-27 (×4): 25 ug via INTRAVENOUS

## 2015-04-27 MED ORDER — FENTANYL CITRATE (PF) 100 MCG/2ML IJ SOLN
INTRAMUSCULAR | Status: DC | PRN
  Administered 2015-04-27: 100 ug via INTRAVENOUS
  Administered 2015-04-27 (×5): 50 ug via INTRAVENOUS

## 2015-04-27 MED ORDER — LIDOCAINE HCL (CARDIAC) 20 MG/ML IV SOLN
INTRAVENOUS | Status: DC | PRN
  Administered 2015-04-27: 100 mg via INTRAVENOUS

## 2015-04-27 MED ORDER — ONDANSETRON HCL 4 MG/2ML IJ SOLN: 4.0000 mg | Freq: Once | INTRAMUSCULAR | Status: DC | PRN

## 2015-04-27 MED ORDER — ZOLPIDEM TARTRATE 5 MG PO TABS: 5.0000 mg | ORAL_TABLET | Freq: Every evening | ORAL | Status: DC | PRN

## 2015-04-27 MED ORDER — MIDAZOLAM HCL 2 MG/2ML IJ SOLN
INTRAMUSCULAR | Status: AC
  Administered 2015-04-27: 1 mg via INTRAVENOUS
  Filled 2015-04-27: qty 2

## 2015-04-27 MED ORDER — MIDAZOLAM HCL 2 MG/2ML IJ SOLN
1.0000 mg | Freq: Once | INTRAMUSCULAR | Status: AC
Start: 2015-04-27 — End: 2015-04-27
  Administered 2015-04-27: 1 mg via INTRAVENOUS

## 2015-04-27 MED ORDER — ACETAMINOPHEN 10 MG/ML IV SOLN
INTRAVENOUS | Status: DC | PRN
  Administered 2015-04-27: 1000 mg via INTRAVENOUS

## 2015-04-27 MED ORDER — BUPROPION HCL ER (XL) 300 MG PO TB24
300.0000 mg | ORAL_TABLET | Freq: Every day | ORAL | Status: DC
  Administered 2015-04-27 – 2015-04-28 (×2): 300 mg via ORAL
  Filled 2015-04-27 (×3): qty 1

## 2015-04-27 MED ORDER — IBUPROFEN 800 MG PO TABS
800.0000 mg | ORAL_TABLET | Freq: Three times a day (TID) | ORAL | Status: DC
  Administered 2015-04-27 – 2015-04-28 (×3): 800 mg via ORAL
  Filled 2015-04-27 (×3): qty 1

## 2015-04-27 MED ORDER — CEFAZOLIN SODIUM-DEXTROSE 2-3 GM-% IV SOLR
INTRAVENOUS | Status: AC
  Administered 2015-04-27: 2 g via INTRAVENOUS
  Filled 2015-04-27: qty 50

## 2015-04-27 SURGICAL SUPPLY — 58 items
BAG URO DRAIN 2000ML W/SPOUT (MISCELLANEOUS) ×4 IMPLANT
BLADE SURG SZ11 CARB STEEL (BLADE) ×4 IMPLANT
CANISTER SUCT 1200ML W/VALVE (MISCELLANEOUS) ×4 IMPLANT
CATH FOL LEG HOLDER (MISCELLANEOUS) ×4 IMPLANT
CATH FOLEY 2WAY  5CC 16FR (CATHETERS) ×2
CATH URTH 16FR FL 2W BLN LF (CATHETERS) ×2 IMPLANT
CHLORAPREP W/TINT 26ML (MISCELLANEOUS) ×4 IMPLANT
CLOSURE WOUND 1/2 X4 (GAUZE/BANDAGES/DRESSINGS) ×1
DEFOGGER SCOPE WARMER CLEARIFY (MISCELLANEOUS) ×4 IMPLANT
DEVICE SUTURE ENDOST 10MM (ENDOMECHANICALS) ×4 IMPLANT
DRESSING TELFA 4X3 1S ST N-ADH (GAUZE/BANDAGES/DRESSINGS) ×4 IMPLANT
DRSG TEGADERM 2-3/8X2-3/4 SM (GAUZE/BANDAGES/DRESSINGS) ×12 IMPLANT
ENDOSTITCH 0 SINGLE 48 (SUTURE) IMPLANT
GAUZE SPONGE NON-WVN 2X2 STRL (MISCELLANEOUS) ×4 IMPLANT
GLOVE BIO SURGEON STRL SZ7.5 (GLOVE) ×20 IMPLANT
GLOVE INDICATOR 7.0 STRL GRN (GLOVE) ×8 IMPLANT
GLOVE SURG LX 7.0 MICRO (GLOVE) ×2
GLOVE SURG LX STRL 7.0 MICRO (GLOVE) ×2 IMPLANT
GOWN STRL REUS W/ TWL LRG LVL3 (GOWN DISPOSABLE) ×8 IMPLANT
GOWN STRL REUS W/ TWL XL LVL3 (GOWN DISPOSABLE) ×2 IMPLANT
GOWN STRL REUS W/TWL LRG LVL3 (GOWN DISPOSABLE) ×8
GOWN STRL REUS W/TWL XL LVL3 (GOWN DISPOSABLE) ×2
GRASPER SUT TROCAR 14GX15 (MISCELLANEOUS) IMPLANT
HOLDER DRSG NASAL ADJUST (MISCELLANEOUS) IMPLANT
IRRIGATION STRYKERFLOW (MISCELLANEOUS) ×2 IMPLANT
IRRIGATOR STRYKERFLOW (MISCELLANEOUS) ×4
IV LACTATED RINGERS 1000ML (IV SOLUTION) ×4 IMPLANT
KIT RM TURNOVER STRD PROC AR (KITS) ×4 IMPLANT
LABEL OR SOLS (LABEL) IMPLANT
LIQUID BAND (GAUZE/BANDAGES/DRESSINGS) ×4 IMPLANT
MANIPULATOR VCARE LG CRV RETR (MISCELLANEOUS) IMPLANT
MANIPULATOR VCARE STD CRV RETR (MISCELLANEOUS) ×4 IMPLANT
NEEDLE VERESS 14GA 120MM (NEEDLE) IMPLANT
NS IRRIG 500ML POUR BTL (IV SOLUTION) ×4 IMPLANT
OCCLUDER COLPOPNEUMO (BALLOONS) ×4 IMPLANT
PACK GYN LAPAROSCOPIC (MISCELLANEOUS) ×4 IMPLANT
PAD OB MATERNITY 4.3X12.25 (PERSONAL CARE ITEMS) ×4 IMPLANT
PAD PREP 24X41 OB/GYN DISP (PERSONAL CARE ITEMS) ×4 IMPLANT
SCISSORS METZENBAUM CVD 33 (INSTRUMENTS) IMPLANT
SET CYSTO W/LG BORE CLAMP LF (SET/KITS/TRAYS/PACK) IMPLANT
SHEARS HARMONIC ACE PLUS 36CM (ENDOMECHANICALS) ×4 IMPLANT
SLEEVE ENDOPATH XCEL 5M (ENDOMECHANICALS) ×4 IMPLANT
SPONGE LAP 18X18 5 PK (GAUZE/BANDAGES/DRESSINGS) IMPLANT
SPONGE VERSALON 2X2 STRL (MISCELLANEOUS) ×4
STRIP CLOSURE SKIN 1/2X4 (GAUZE/BANDAGES/DRESSINGS) ×3 IMPLANT
SUT DVC VLOC 180 0 12IN GS21 (SUTURE) ×8
SUT MNCRL AB 3-0 PS2 27 (SUTURE) IMPLANT
SUT QUILL 0 20X36 (SUTURE) IMPLANT
SUT VIC AB 2-0 UR6 27 (SUTURE) IMPLANT
SUT VICRYL 0 AB UR-6 (SUTURE) ×8 IMPLANT
SUTURE DVC VLC 180 0 12IN GS21 (SUTURE) ×4 IMPLANT
SYR 50ML LL SCALE MARK (SYRINGE) IMPLANT
SYRINGE 10CC LL (SYRINGE) ×4 IMPLANT
TROCAR ENDO BLADELESS 11MM (ENDOMECHANICALS) ×4 IMPLANT
TROCAR XCEL NON-BLD 5MMX100MML (ENDOMECHANICALS) ×4 IMPLANT
TROCAR XCEL UNIV SLVE 11M 100M (ENDOMECHANICALS) ×4 IMPLANT
TUBING INSUFFLATOR HEATED (MISCELLANEOUS) ×4 IMPLANT
UNIVERSAL  TROCAR 11MM ×4 IMPLANT

## 2015-04-27 NOTE — H&P (Addendum)
H&P Update  Pt was last seen in the office <30 days ago and complete history and physical performed. She is being treated for chronic pelvic pain with reported endometriosis. The surgical history has been reviewed and remains accurate without interval change. The patient was re-examined and patient's physiologic condition has not changed significantly in the last 30 days.  No new pharmacological allergies or types of therapy has been initiated.  Allergies  Allergen Reactions  . Trazodone And Nefazodone Other (See Comments)    Causes her to be hyperactive and causes severe headache that lasts most of the next day.  . Hydrocodone Nausea And Vomiting    Rash/hives itching  . Tomato Swelling and Other (See Comments)    Blisters   . Toradol [Ketorolac Tromethamine] Nausea And Vomiting  . Tramadol Nausea And Vomiting   Scheduled Meds: . ceFAZolin      .  ceFAZolin (ANCEF) IV  2 g Intravenous On Call to OR   Continuous Infusions: . lactated ringers     PRN Meds:.   Past Medical History  Diagnosis Date  . Thyroid disease   . Bipolar 1 disorder   . Hypothyroidism   . Depression   . Endometriosis   . Chronic pain   . HPV (human papilloma virus) anogenital infection    Past Surgical History  Procedure Laterality Date  . Molar pregnancy    Pelvic laparoscopy x 2  BP 119/71 mmHg  Pulse 79  Temp(Src) 98.1 F (36.7 C) (Oral)  Resp 16  Ht 5\' 7"  (1.702 m)  Wt 84.369 kg (186 lb)  BMI 29.12 kg/m2  SpO2 96%  NAD RRR no murmurs CTAB, no wheezing, resps unlabored +BS, soft, NTTP No c/c/e Pelvic exam deferred  The above history was confirmed with the patient. The condition still exists that makes this procedure necessary. Surgical plan includes TLH, BS confirmed on the consent. The treatment plan remains the same, without new options for care.  The patient understands the potential benefits and risks and the consents have been signed and placed on the chart.   Kelsey Maldonado. Archie Balboa MD,  [facility] [phone]

## 2015-04-27 NOTE — Anesthesia Preprocedure Evaluation (Signed)
Anesthesia Evaluation  Patient identified by MRN, date of birth, ID band Patient awake    Reviewed: Allergy & Precautions, NPO status , Patient's Chart, lab work & pertinent test results  Airway Mallampati: I  TM Distance: >3 FB Neck ROM: Full    Dental  (+) Teeth Intact   Pulmonary Current Smoker,  1-2 pack per day smoker. breath sounds clear to auscultation  Pulmonary exam normal       Cardiovascular Exercise Tolerance: Good negative cardio ROS Normal cardiovascular examRhythm:Regular Rate:Normal     Neuro/Psych Depression Bipolar Disorder Has ECT here.   GI/Hepatic   Endo/Other  Hypothyroidism Treated hypothyroidism.  Renal/GU      Musculoskeletal   Abdominal (+)  Abdomen: soft.    Peds  Hematology   Anesthesia Other Findings   Reproductive/Obstetrics                             Anesthesia Physical Anesthesia Plan  ASA: III  Anesthesia Plan: General   Post-op Pain Management:    Induction: Intravenous  Airway Management Planned: Oral ETT  Additional Equipment:   Intra-op Plan:   Post-operative Plan: Extubation in OR  Informed Consent:   Plan Discussed with: CRNA  Anesthesia Plan Comments:         Anesthesia Quick Evaluation

## 2015-04-27 NOTE — Anesthesia Procedure Notes (Signed)
Procedure Name: Intubation Date/Time: 04/27/2015 1:02 PM Performed by: Doreen Salvage Pre-anesthesia Checklist: Patient identified, Patient being monitored, Timeout performed, Emergency Drugs available and Suction available Patient Re-evaluated:Patient Re-evaluated prior to inductionOxygen Delivery Method: Circle system utilized Preoxygenation: Pre-oxygenation with 100% oxygen Intubation Type: IV induction Ventilation: Mask ventilation without difficulty Laryngoscope Size: Mac and 3 Grade View: Grade I Tube type: Oral Tube size: 7.0 mm Number of attempts: 1 Airway Equipment and Method: Stylet Placement Confirmation: ETT inserted through vocal cords under direct vision,  positive ETCO2 and breath sounds checked- equal and bilateral Secured at: 21 cm Tube secured with: Tape Dental Injury: Teeth and Oropharynx as per pre-operative assessment

## 2015-04-27 NOTE — Progress Notes (Signed)
Daily Progress Note  Service:  [facility] Day:    Attending:  Ruffin Frederick, MD    Subjective:                                                                                          The patient feels well.  Pain is moderately controlled with current medications. Nausea/vomiting:  no nausea, no vomiting. Urinary output is adequate via Foley. Flatus: No. Ambulating: No The patient is tolerating a normal diet.   ROS:  Denies HA, CP, SOB, FC, NV, dizziness, lightheadedness, leg tenderness.     Objective:    24hour vital signs:  Temp:  [97 F (36.1 C)-98.5 F (36.9 C)] 98.4 F (36.9 C) (06/22 1910) Pulse Rate:  [62-133] 85 (06/22 1910) Resp:  [16-25] 18 (06/22 1910) BP: (97-120)/(58-83) 97/61 mmHg (06/22 1910) SpO2:  [94 %-100 %] 100 % (06/22 1832) Weight:  [84.369 kg (186 lb)] 84.369 kg (186 lb) (06/22 1111)  SpO2 Readings from Last 1 Encounters:  04/27/15 100%    I/O last 3 completed shifts: In: 1891.7 [I.V.:1891.7] Out: 750 [Urine:700; Blood:50]   General:    alert and no distress  HEENT:   NCAT, MMM, OP clear, EOMI  Cardiovascular:   RRR, no murmurs  Pulmonary:   CTAB, no wheezing, no crackles, resps unlabored  Abdomen:  absent, no TTP, no distension  Incision:  healing well, no significant drainage, no dehiscence, no significant erythema  Extremities:  no edema, No evidence of DVT seen on physical exam.    Labs: Recent Labs     04/26/15  0949  WBC  6.4  HGB  14.4  HCT  44.1  MCV  92.2  PLT  248   Recent Labs     04/26/15  0949  NA  142  K  3.9  CL  110  CO2  24  BUN  13  CREATININE  0.77  CALCIUM  9.2   No results for input(s): BILITOT, BILIDIR, ALKPHOS, AST, ALT, PROT in the last 72 hours.  Invalid input(s): LABALB  Medications: Scheduled Meds: . buPROPion  300 mg Oral Daily  . docusate sodium  100 mg Oral BID  . FLUoxetine  80 mg Oral Daily  . ibuprofen  800 mg Oral TID  . lamoTRIgine  50 mg Oral BID  . [START ON  04/28/2015] levothyroxine  137 mcg Oral QAC breakfast  . nicotine  21 mg Transdermal Daily  . ondansetron  4 mg Oral Q6H  . ramelteon  8 mg Oral QHS  . topiramate  200 mg Oral BID    Continuous Infusions: . lactated ringers 125 mL/hr at 04/27/15 1850    PRN Meds:HYDROmorphone (DILAUDID) injection, opium-belladonna, oxyCODONE-acetaminophen, simethicone, zolpidem  .Assessment:   34 y.o. POD #0 s/p TLH BS for chronic pelvic pain, EBL 50 cc. Doing well postoperatively.  Plan:  1. Post-op: - Pain: well controlled on current regimen of Percocet and Dilaudid for breakthrough. - CVS:  No issues - Pulm:  IS bedside - GI: Clears, ADAT. No N/V. Awaiting flatus.  Zofran PRN.  - GU: Foley in place. Adeq  UOP. D/c Foley now.  Preop Cr 0.77.  Postop Cr in the AM - Heme:  preop Hgb 14.4;  Postop Hgb in the AM  - ID:  Pt received antibiotics at the appropriate intervals during her surgery. No current signs or symptoms of infection. - FEN:  MIVF LR 125 cc/hr - Endocrine:  No Issues - DVT ppx: SCD's, ambulate TID - Wound: Healing appropriately after surgery. - Path: Discussed operative findings with patient and her family. All questions answered.   3. Dispo:  - Anticipate discharge home on POD #1  Youlanda Roys. Archie Balboa MD, [facility] [phone]

## 2015-04-27 NOTE — Op Note (Signed)
Operatative Note  Pre-Op Dx:   1. Chronic pelvic pain 2. Reported biopsy-proven endometriosis  Post-Op Dx:   same  3. Pelvic adhesions  Procedure(s): HYSTERECTOMY TOTAL LAPAROSCOPIC/BIL.SALPINGECTOMY LYSIS OF ADHESION  Surgeon:  Nathaneil Canary MD, MPH Assistant(s):  Laverta Baltimore MD Staff:  Circulator: Bryon Lions, RN Relief Circulator: Loyal Buba, RN Relief Scrub: Cephas Darby Scrub Person: Maren Reamer, RN  Anesthesia:  Choice GETA  Antibiotics:  Cefazolin 2g  DVT prophylaxis: SCDs   EBL:  50cc IVF:  1400cc UOP: 200 cc Drains: Foley catheter   Specimen(s):    ID Type Source Tests Collected by Time Destination  1 : uterus  with cervix, bil. fallopian tubes Tissue [facility] Other SURGICAL PATHOLOGY Ruffin Frederick, MD 04/27/2015 1430     Findings:  ASA 2. Wound class 2 Uterus: 8 cm no significant fibroids visible Ovaries and Tubes: Benign appearing bilaterally Adhesions: Significant LLQ adhesions Other: No evidence of endometriosis, all quadrants of the pelvis inspected.   Complications:  none   Disposition:  Extubated to PACU.   Technique:    After a satisfactory level of General Anesthesia was achieved, the patient was placed in modified lithotomy position with SCD stockings on and functioning. The abdomen, vulva and vagina were prepped with Cloraprep and Hibaclens and she was draped out with paper laparoscopy drapes. A time out was done per protocol. A Foley catheter was placed in the bladder. With double gloved technique, a graves speculum was placed in the vagina and a medium V-Care was placed to manipulate the uterus. This was covered over with a sterile towel. All energy used was Harmonic Ace unless otherwise specified.   A small incision was made in the umbilicus and using a direct vision technique with the 11 mm optical trocal, the 10 mm laparoscope was inserted into the peritoneal cavity. Lower port inisicions were made lateral to  the inferior epigastric vessels which were visualized. Five and 11 mm nonbladed trocars and sleeves were inserted through these lower ports, right and left respectively. An inspection of the upper abdomen and then pelvis was undertaken with findings as above. Fifteen minutes were spent lysing adhesions of the omentum to the LLQ pelvic sidewall. This was accomplished using Harmonic Ace.   The mesosalpinx were transected bilaterally with the Harmonic Ace and the fallopian tubes were freed from the ovaries. The utero-ovarian ligaments were then transected bilaterally. The round ligaments were then transected. The broad ligaments were then opened. And the bladder flap created with the Harmonic. The uterine arteries were then transected with the Harmonic. The colpotomy was then created with the Harmonic. The uterus was then delivered through the vagina. The cuff was noted to be hemostatic but not overly coagulated. The cuff was then closed with 0 V-lock in a running fashion. The pedicles were again inspected and noted to be hemostatic. The ovaries were normal appearing. The pelvis was then thoroughly irrigated and suctioned. Adequate vermiculation was noted in both ureters.   The left trocar sleeve was removed and the fascia was closed with a Carter-Thomason cone and 0-Vicryl. The pneumoperitoneum was evacuated and 58mm port removed. The 56mm port was removed and deep tissue closed with 0-Vicryl, the skin incisions were closed with 3-0 monocryl. Final sponge needle and instrument counts were correct. Ending time out was completed. She was extubated without difficulty and taken to the PACU in stable condition.   Youlanda Roys. Archie Balboa MD, [facility] [phone]

## 2015-04-27 NOTE — Transfer of Care (Signed)
  Immediate Anesthesia Transfer of Care Note  Patient: Kelsey Maldonado  Procedure(s) Performed: Procedure(s): HYSTERECTOMY TOTAL LAPAROSCOPIC/BIL.SALPINGECTOMY (Bilateral) LYSIS OF ADHESION (N/A)  Patient Location: PACU  Anesthesia Type:General  Level of Consciousness: sedated  Airway & Oxygen Therapy: Patient Spontanous Breathing and Patient connected to face mask oxygen  Post-op Assessment: Report given to RN and Post -op Vital signs reviewed and stable  Post vital signs: Reviewed and stable  Last Vitals:  Filed Vitals:   04/27/15 1529  BP: 112/58  Pulse: 91  Temp: 36.3 C  Resp: 20    Complications: No apparent anesthesia complications

## 2015-04-27 NOTE — Anesthesia Postprocedure Evaluation (Signed)
  Anesthesia Post-op Note  Patient: Kelsey Maldonado  Procedure(s) Performed: Procedure(s): HYSTERECTOMY TOTAL LAPAROSCOPIC/BIL.SALPINGECTOMY (Bilateral) LYSIS OF ADHESION (N/A)  Anesthesia type:General  Patient location: PACU  Post pain: Pain level controlled  Post assessment: Post-op Vital signs reviewed, Patient's Cardiovascular Status Stable, Respiratory Function Stable, Patent Airway and No signs of Nausea or vomiting  Post vital signs: Reviewed and stable  Last Vitals:  Filed Vitals:   04/27/15 1559  BP: 120/83  Pulse: 92  Temp:   Resp: 25    Level of consciousness: awake, alert  and patient cooperative  Complications: No apparent anesthesia complications

## 2015-04-28 DIAGNOSIS — N879 Dysplasia of cervix uteri, unspecified: Secondary | ICD-10-CM | POA: Diagnosis not present

## 2015-04-28 LAB — BASIC METABOLIC PANEL
Anion gap: 6 (ref 5–15)
BUN: 11 mg/dL (ref 6–20)
CHLORIDE: 109 mmol/L (ref 101–111)
CO2: 25 mmol/L (ref 22–32)
Calcium: 8.7 mg/dL — ABNORMAL LOW (ref 8.9–10.3)
Creatinine, Ser: 0.62 mg/dL (ref 0.44–1.00)
GFR calc non Af Amer: >60 mL/min (ref >60)
GLUCOSE: 110 mg/dL — AB (ref 65–99)
POTASSIUM: 3.4 mmol/L — AB (ref 3.5–5.1)
Sodium: 140 mmol/L (ref 135–145)

## 2015-04-28 LAB — CBC
HEMATOCRIT: 38 % (ref 35.0–47.0)
Hemoglobin: 12.6 g/dL (ref 12.0–16.0)
MCH: 30.5 pg (ref 26.0–34.0)
MCHC: 33.2 g/dL (ref 32.0–36.0)
MCV: 91.7 fL (ref 80.0–100.0)
Platelets: 219 10*3/uL (ref 150–440)
RBC: 4.14 MIL/uL (ref 3.80–5.20)
RDW: 13.5 % (ref 11.5–14.5)
WBC: 11.2 10*3/uL — ABNORMAL HIGH (ref 3.6–11.0)

## 2015-04-28 MED ORDER — DOCUSATE SODIUM 100 MG PO CAPS: 100.0000 mg | ORAL_CAPSULE | Freq: Two times a day (BID) | ORAL | Status: DC

## 2015-04-28 MED ORDER — IBUPROFEN 800 MG PO TABS: 800.0000 mg | ORAL_TABLET | Freq: Three times a day (TID) | ORAL | Status: DC

## 2015-04-28 MED ORDER — OXYCODONE-ACETAMINOPHEN 10-325 MG PO TABS: 1.0000 | ORAL_TABLET | ORAL | Status: DC | PRN

## 2015-04-28 MED FILL — Belladonna Alkaloids & Opium Suppos 16.2-60 MG: RECTAL | Qty: 1 | Status: AC

## 2015-04-28 NOTE — Discharge Instructions (Signed)
Signs and Symptoms to Report Call our office at [phone] if you have any of the following.   Fever over 100.4 degrees or higher  Severe stomach pain not relieved with pain medications  Bright red bleeding thats heavier than a period that does not slow with rest  To go the bathroom a lot (frequency), you cant hold your urine (urgency), or it hurts when you empty your bladder (urinate)  Chest pain  Shortness of breath  Pain in the calves of your legs  Severe nausea and vomiting not relieved with anti-nausea medications  Signs of infection around your wounds, such as redness, hot to touch, swelling, green/yellow drainage (like pus), bad smelling discharge  Any concerns  What You Can Expect after Surgery  You may see some pink tinged, bloody fluid and bruising around the wound. This is normal.  You may notice shoulder and neck pain. This is caused by the gas used during surgery to expand your abdomen so your surgeon could get to the uterus easier.  You may have a sore throat because of the tube in your mouth during general anesthesia. This will go away in 2 to 3 days.  You may have some stomach cramps.  You may notice spotting on your panties.  You may have pain around the incision sites.   Activities after Your Discharge Follow these guidelines to help speed your recovery at home:  Dont drive if you are in pain or taking narcotic pain medicine. You may drive when you can safely slam on the brakes, turn the wheel forcefully, and rotate your torso comfortably. This is typically 1-2 weeks. Practice in a parking lot or side street prior to attempting to drive regularly.   Ask others to help with household chores for 4 weeks.  Do not lift anything heavier that 10 pounds for 4-6 weeks. This includes pets, children, and groceries.  Dont do strenuous activities, exercises, or sports like vacuuming, tennis, squash, etc. until your doctor says it is safe to do so. ---If  you had a hysterectomy (abdominal, laparoscopic, or vaginal) do not have intercourse for 8-10 weeks.   Walk as you feel able. Rest often since it may take two or three weeks for your energy level to return to normal.   You may climb stairs  Avoid constipation:   -Eat fruits, vegetables, and whole grains. Eat small meals as your appetite will take time to return to normal.   -Drink 6 to 8 glasses of water each day unless your doctor has told you to limit your fluids.   -Use a laxative or stool softener as needed if constipation becomes a problem. You may take Miralax, metamucil, Citrucil, Colace, Senekot, FiberCon, etc. If this does not relieve the constipation, try two tablespoons of Milk Of Magnesia every 8 hours until your bowels move.   You may shower. Gently wash the wounds with a mild soap and water. Pat dry.  Do not get in a hot tub, swimming pool, etc. until your doctor agrees.  Do not use lotions, oils, powders on the wounds.  Do not douche, use tampons, or have sex until your doctor says it is okay.  Take your pain medicine when you need it. The medicine may not work as well if the pain is bad.  Take the medicines you were taking before surgery. Other medications you will need are pain medications (Motrin or Percocet)

## 2015-04-28 NOTE — Discharge Summary (Signed)
Physician Discharge Summary  Patient ID: PINA SIRIANNI MRN: [id number] DOB/AGE: Jan 12, 1981 34 y.o.  Admit date: 04/27/2015 Discharge date: 04/28/2015  Admission Diagnoses:  Discharge Diagnoses:  Active Problems:   Chronic pelvic pain in female   Discharged Condition: good  Hospital Course: The patient was admitted on the day of scheduled surgery and proceeded to the operating room as scheduled for the below stated procedure without complications, [id number].  (For complete operative information, please see operative report).  Postoperatively she was admitted to the floor. Pain was initially managed with IV Dilaudid which was transitioned to PO Percocet when the patient was tolerating a regular diet on postoperative day number 0.  Postoperative labs were stable (Hgb 14 --> pendign and creatinine 0.77 --> pending).    Foley cath was discontinued on postoperative day number 0 and patient passed a voiding trial without difficulty.   Patient was felt to be stable for discharge on postoperative day number 1 when she was tolerating a regular diet, pain was controlled with po pain medications, and she was ambulating and voiding without difficulty. Vital signs were stable and physical exam remained benign throughout her hospital stay. Incision was clean,dry, and intact with sutures.  She will follow up per below for post-op check.  Rx given for Percocet, Motrin, and Colace.    She was given specific instructions and numbers to call in written and verbal format. She verbalized understanding, agrees with the plan of care, and all questions answered to her satisfaction.  Consults: None  Significant Diagnostic Studies: None  Treatments: surgery: TLH with bilateral salpingectomy  Discharge Exam: Blood pressure 98/58, pulse 77, temperature 97.6 F (36.4 C), temperature source Oral, resp. rate 18, height 5\' 7"  (1.702 m), weight 84.369 kg (186 lb), SpO2 100 %.   General:  alert and no distress   HEENT:  NCAT, MMM, OP clear, EOMI  Cardiovascular:  RRR, no murmurs  Pulmonary:  CTAB, no wheezing, no crackles, resps unlabored  Abdomen: Active BS, no TTP, no distension  Incision: healing well, no significant drainage, no dehiscence, no significant erythema  Extremities: no edema, No evidence of DVT seen on physical exam.         Disposition: 01-Home or Self Care  Discharge Instructions    Diet - low sodium heart healthy    Complete by:  As directed      Discharge instructions    Complete by:  As directed   See instructions     Increase activity slowly    Complete by:  As directed             Medication List    STOP taking these medications        oxyCODONE-acetaminophen 5-325 MG per tablet  Commonly known as:  PERCOCET/ROXICET  Replaced by:  oxyCODONE-acetaminophen 10-325 MG per tablet     oxyCODONE-acetaminophen 7.5-325 MG per tablet  Commonly known as:  PERCOCET      TAKE these medications        alprazolam 2 MG tablet  Commonly known as:  XANAX  Take 2 mg by mouth every 4 (four) hours.     buPROPion 300 MG 24 hr tablet  Commonly known as:  WELLBUTRIN XL  Take 300 mg by mouth daily.     docusate sodium 100 MG capsule  Commonly known as:  COLACE  Take 1 capsule (100 mg total) by mouth 2 (two) times daily.     FLUoxetine 20 MG capsule  Commonly known as:  PROZAC  Take 20 mg by mouth daily.     FLUoxetine 40 MG capsule  Commonly known as:  PROZAC  Take 80 mg by mouth daily.     FLUoxetine 40 MG capsule  Commonly known as:  PROZAC  Take 1 capsule (40 mg total) by mouth daily.     hydrOXYzine 50 MG tablet  Commonly known as:  ATARAX/VISTARIL  Take one tablet 3 x a day and then 1 at bedtime.     ibuprofen 800 MG tablet  Commonly known as:  ADVIL,MOTRIN  Take 1 tablet (800 mg total) by mouth 3 (three) times daily.     lamoTRIgine 25 MG tablet  Commonly known as:  LAMICTAL  Take 2 tablets (50 mg total) by mouth 2 (two) times  daily.     levothyroxine 137 MCG tablet  Commonly known as:  SYNTHROID, LEVOTHROID  Take 137 mcg by mouth daily before breakfast.     levothyroxine 112 MCG tablet  Commonly known as:  SYNTHROID, LEVOTHROID  Take 112 mcg by mouth daily.     ondansetron 4 MG tablet  Commonly known as:  ZOFRAN  Take 1 tablet (4 mg total) by mouth every 6 (six) hours.     oxyCODONE-acetaminophen 10-325 MG per tablet  Commonly known as:  PERCOCET  Take 1 tablet by mouth every 4 (four) hours as needed for pain.     ramelteon 8 MG tablet  Commonly known as:  ROZEREM  Take 1 tablet (8 mg total) by mouth at bedtime.     topiramate 200 MG tablet  Commonly known as:  TOPAMAX  Take 1 tablet (200 mg total) by mouth 2 (two) times daily.     zolpidem 10 MG tablet  Commonly known as:  AMBIEN  Take 20 mg by mouth at bedtime as needed for sleep.           Follow-up Information    Follow up with Hellen Shanley, Clayburn Pert, MD In 2 weeks.   Specialty:  Obstetrics and Gynecology   Contact information:   [address] [place] [address] [phone]       Signed: Ruffin Frederick 04/28/2015, 6:19 AM

## 2015-04-29 LAB — SURGICAL PATHOLOGY

## 2016-05-15 ENCOUNTER — Emergency Department
Admission: EM | Admit: 2016-05-15 | Discharge: 2016-05-15 | Disposition: A | Discharge status: Home / Self Care | Payer: Medicare Other | Attending: Emergency Medicine | Admitting: Emergency Medicine

## 2016-05-15 ENCOUNTER — Encounter: Payer: Self-pay | Admitting: Emergency Medicine

## 2016-05-15 DIAGNOSIS — F329 Major depressive disorder, single episode, unspecified: Secondary | ICD-10-CM | POA: Diagnosis not present

## 2016-05-15 DIAGNOSIS — F319 Bipolar disorder, unspecified: Secondary | ICD-10-CM | POA: Diagnosis not present

## 2016-05-15 DIAGNOSIS — R45851 Suicidal ideations: Secondary | ICD-10-CM

## 2016-05-15 DIAGNOSIS — F17210 Nicotine dependence, cigarettes, uncomplicated: Secondary | ICD-10-CM | POA: Diagnosis not present

## 2016-05-15 DIAGNOSIS — E039 Hypothyroidism, unspecified: Secondary | ICD-10-CM | POA: Diagnosis not present

## 2016-05-15 DIAGNOSIS — Z79899 Other long term (current) drug therapy: Secondary | ICD-10-CM | POA: Diagnosis not present

## 2016-05-15 LAB — COMPREHENSIVE METABOLIC PANEL
ALBUMIN: 4.7 g/dL (ref 3.5–5.0)
ALT: 28 U/L (ref 14–54)
AST: 27 U/L (ref 15–41)
Alkaline Phosphatase: 56 U/L (ref 38–126)
Anion gap: 8 (ref 5–15)
BILIRUBIN TOTAL: 0.8 mg/dL (ref 0.3–1.2)
BUN: 15 mg/dL (ref 6–20)
CO2: 27 mmol/L (ref 22–32)
Calcium: 9.6 mg/dL (ref 8.9–10.3)
Chloride: 105 mmol/L (ref 101–111)
Creatinine, Ser: 0.75 mg/dL (ref 0.44–1.00)
GFR calc Af Amer: >60 mL/min (ref >60)
GFR calc non Af Amer: >60 mL/min (ref >60)
Glucose, Bld: 126 mg/dL — ABNORMAL HIGH (ref 65–99)
POTASSIUM: 4.1 mmol/L (ref 3.5–5.1)
Sodium: 140 mmol/L (ref 135–145)
TOTAL PROTEIN: 8.3 g/dL — AB (ref 6.5–8.1)

## 2016-05-15 LAB — URINE DRUG SCREEN, QUALITATIVE (ARMC ONLY)
AMPHETAMINES, UR SCREEN: NOT DETECTED
Barbiturates, Ur Screen: NOT DETECTED
Benzodiazepine, Ur Scrn: POSITIVE — AB
Cannabinoid 50 Ng, Ur ~~LOC~~: NOT DETECTED
Cocaine Metabolite,Ur ~~LOC~~: NOT DETECTED
MDMA (ECSTASY) UR SCREEN: NOT DETECTED
Methadone Scn, Ur: NOT DETECTED
OPIATE, UR SCREEN: NOT DETECTED
Phencyclidine (PCP) Ur S: NOT DETECTED
Tricyclic, Ur Screen: NOT DETECTED

## 2016-05-15 LAB — CBC
HCT: 45.4 % (ref 35.0–47.0)
Hemoglobin: 16 g/dL (ref 12.0–16.0)
MCH: 31.9 pg (ref 26.0–34.0)
MCHC: 35.2 g/dL (ref 32.0–36.0)
MCV: 90.5 fL (ref 80.0–100.0)
Platelets: 235 10*3/uL (ref 150–440)
RBC: 5.02 MIL/uL (ref 3.80–5.20)
RDW: 13.2 % (ref 11.5–14.5)
WBC: 7.5 10*3/uL (ref 3.6–11.0)

## 2016-05-15 LAB — ACETAMINOPHEN LEVEL: Acetaminophen (Tylenol), Serum: <10 ug/mL — ABNORMAL LOW (ref 10–30)

## 2016-05-15 LAB — SALICYLATE LEVEL: Salicylate Lvl: <4 mg/dL (ref 2.8–30.0)

## 2016-05-15 LAB — ETHANOL: Alcohol, Ethyl (B): <5 mg/dL (ref <5)

## 2016-05-15 MED ORDER — LEVOTHYROXINE SODIUM 112 MCG PO TABS: 112.0000 ug | ORAL_TABLET | Freq: Every day | ORAL | Status: DC

## 2016-05-15 MED ORDER — TOPIRAMATE 100 MG PO TABS
200.0000 mg | ORAL_TABLET | Freq: Two times a day (BID) | ORAL | Status: DC
  Administered 2016-05-15: 200 mg via ORAL
  Filled 2016-05-15: qty 2

## 2016-05-15 MED ORDER — FLUOXETINE HCL 20 MG PO CAPS: 20.0000 mg | ORAL_CAPSULE | Freq: Every day | ORAL | Status: DC

## 2016-05-15 MED ORDER — BUPROPION HCL ER (XL) 300 MG PO TB24: 300.0000 mg | ORAL_TABLET | Freq: Every day | ORAL | Status: DC

## 2016-05-15 MED ORDER — FLUOXETINE HCL 40 MG PO CAPS: 40.0000 mg | ORAL_CAPSULE | Freq: Every day | ORAL | Status: DC

## 2016-05-15 MED ORDER — TOPIRAMATE 200 MG PO TABS: 200.0000 mg | ORAL_TABLET | Freq: Two times a day (BID) | ORAL | Status: DC

## 2016-05-15 MED ORDER — ZOLPIDEM TARTRATE 5 MG PO TABS
10.0000 mg | ORAL_TABLET | Freq: Every day | ORAL | Status: DC
  Administered 2016-05-15: 10 mg via ORAL
  Filled 2016-05-15: qty 2

## 2016-05-15 NOTE — ED Notes (Signed)
ptt o ed with c/o suicidal thoughts and depression x 2 months.  Pt states she has not been on meds for 2 months and has not been able to sleep.  Pt denies specific plan for suicide.

## 2016-05-15 NOTE — ED Notes (Signed)
Pt unable to void at this moment, given specimen cup for when is able to urinate.

## 2016-05-15 NOTE — ED Provider Notes (Signed)
-----------------------------------------   11:47 PM on 05/15/2016 -----------------------------------------  Patient was seen by psychiatry. At this time they do not feel that she meets inpatient criteria. They have provided patient with prescriptions for her medications. Will give [facility] follow-up information.  Nance Pear, MD 05/15/16 [phone]

## 2016-05-15 NOTE — Discharge Instructions (Signed)
Please seek medical attention and help for any thoughts about wanting to harm herself, harm others, any concerning change in behavior, severe depression, inappropriate drug use or any other new or concerning symptoms. °Major Depressive Disorder °Major depressive disorder is a mental illness. It also may be called clinical depression or unipolar depression. Major depressive disorder usually causes feelings of sadness, hopelessness, or helplessness. Some people with this disorder do not feel particularly sad but lose interest in doing things they used to enjoy (anhedonia). Major depressive disorder also can cause physical symptoms. It can interfere with work, school, relationships, and other normal everyday activities. The disorder varies in severity but is longer lasting and more serious than the sadness we all feel from time to time in our lives. °Major depressive disorder often is triggered by stressful life events or major life changes. Examples of these triggers include divorce, loss of your job or home, a move, and the death of a family member or close friend. Sometimes this disorder occurs for no obvious reason at all. People who have family members with major depressive disorder or bipolar disorder are at higher risk for developing this disorder, with or without life stressors. Major depressive disorder can occur at any age. It may occur just once in your life (single episode major depressive disorder). It may occur multiple times (recurrent major depressive disorder). °SYMPTOMS °People with major depressive disorder have either anhedonia or depressed mood on nearly a daily basis for at least 2 weeks or longer. Symptoms of depressed mood include: °· Feelings of sadness (blue or down in the dumps) or emptiness. °· Feelings of hopelessness or helplessness. °· Tearfulness or episodes of crying (may be observed by others). °· Irritability (children and adolescents). °In addition to depressed mood or anhedonia or  both, people with this disorder have at least four of the following symptoms: °· Difficulty sleeping or sleeping too much.   °· Significant change (increase or decrease) in appetite or weight.   °· Lack of energy or motivation. °· Feelings of guilt and worthlessness.   °· Difficulty concentrating, remembering, or making decisions. °· Unusually slow movement (psychomotor retardation) or restlessness (as observed by others).   °· Recurrent wishes for death, recurrent thoughts of self-harm (suicide), or a suicide attempt. °People with major depressive disorder commonly have persistent negative thoughts about themselves, other people, and the world. People with severe major depressive disorder may experience distorted beliefs or perceptions about the world (psychotic delusions). They also may see or hear things that are not real (psychotic hallucinations). °DIAGNOSIS °Major depressive disorder is diagnosed through an assessment by your health care provider. Your health care provider will ask about aspects of your daily life, such as mood, sleep, and appetite, to see if you have the diagnostic symptoms of major depressive disorder. Your health care provider may ask about your medical history and use of alcohol or drugs, including prescription medicines. Your health care provider also may do a physical exam and blood work. This is because certain medical conditions and the use of certain substances can cause major depressive disorder-like symptoms (secondary depression). Your health care provider also may refer you to a mental health specialist for further evaluation and treatment. °TREATMENT °It is important to recognize the symptoms of major depressive disorder and seek treatment. The following treatments can be prescribed for this disorder:   °· Medicine. Antidepressant medicines usually are prescribed. Antidepressant medicines are thought to correct chemical imbalances in the brain that are commonly associated with  major depressive disorder. Other types of medicine may be   added if the symptoms do not respond to antidepressant medicines alone or if psychotic delusions or hallucinations occur. °· Talk therapy. Talk therapy can be helpful in treating major depressive disorder by providing support, education, and guidance. Certain types of talk therapy also can help with negative thinking (cognitive behavioral therapy) and with relationship issues that trigger this disorder (interpersonal therapy). °A mental health specialist can help determine which treatment is best for you. Most people with major depressive disorder do well with a combination of medicine and talk therapy. Treatments involving electrical stimulation of the brain can be used in situations with extremely severe symptoms or when medicine and talk therapy do not work over time. These treatments include electroconvulsive therapy, transcranial magnetic stimulation, and vagal nerve stimulation. °  °This information is not intended to replace advice given to you by your health care provider. Make sure you discuss any questions you have with your health care provider. °  °Document Released: 02/16/2013 Document Revised: 11/12/2014 Document Reviewed: 02/16/2013 °[employer] Interactive Patient Education ©2016 [employer] ° °
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
## 2016-05-15 NOTE — ED Provider Notes (Signed)
[facility] Emergency Department Provider Note   ____________________________________________    I have reviewed the triage vital signs and the nursing notes.   HISTORY  Chief Complaint Suicidal     HPI Kelsey Maldonado is a 35 y.o. female who presents with depression and suicidal ideation. Patient reports a long history of stroke with depression and she reports over the last 2 months it is worsened for her. She feels like crying all the time. She does have a history of self injury but denies any recent self injury. No ingestions. She does not have a plan but is suicidal.   Past Medical History  Diagnosis Date  . Thyroid disease   . Bipolar 1 disorder ([place])   . Hypothyroidism   . Depression   . Endometriosis   . Chronic pain   . HPV (human papilloma virus) anogenital infection     Patient Active Problem List   Diagnosis Date Noted  . Chronic pelvic pain in female 04/27/2015  . Bipolar I disorder, most recent episode (or current) depressed, severe, without mention of psychotic behavior 06/05/2013    Past Surgical History  Procedure Laterality Date  . Molar pregnancy    . Laparoscopic hysterectomy Bilateral 04/27/2015    Procedure: HYSTERECTOMY TOTAL LAPAROSCOPIC/BIL.SALPINGECTOMY;  Surgeon: Ruffin Frederick, MD;  Location: [facility] ORS;  Service: Gynecology;  Laterality: Bilateral;  . Lysis of adhesion N/A 04/27/2015    Procedure: LYSIS OF ADHESION;  Surgeon: Ruffin Frederick, MD;  Location: [facility] ORS;  Service: Gynecology;  Laterality: N/A;    Current Outpatient Rx  Name  Route  Sig  Dispense  Refill  . alprazolam (XANAX) 2 MG tablet   Oral   Take 2 mg by mouth every 4 (four) hours.         Marland Kitchen buPROPion (WELLBUTRIN XL) 300 MG 24 hr tablet   Oral   Take 300 mg by mouth daily.         Marland Kitchen docusate sodium (COLACE) 100 MG capsule   Oral   Take 1 capsule (100 mg total) by mouth 2 (two) times daily.   10 capsule   0   .  FLUoxetine (PROZAC) 20 MG capsule   Oral   Take 20 mg by mouth daily.         Marland Kitchen FLUoxetine (PROZAC) 40 MG capsule   Oral   Take 1 capsule (40 mg total) by mouth daily. Patient not taking: Reported on 04/27/2015   30 capsule   0   . FLUoxetine (PROZAC) 40 MG capsule   Oral   Take 80 mg by mouth daily.         . hydrOXYzine (ATARAX/VISTARIL) 50 MG tablet      Take one tablet 3 x a day and then 1 at bedtime. Patient not taking: Reported on 03/17/2015   120 tablet   0   . ibuprofen (ADVIL,MOTRIN) 800 MG tablet   Oral   Take 1 tablet (800 mg total) by mouth 3 (three) times daily.   30 tablet   0   . lamoTRIgine (LAMICTAL) 25 MG tablet   Oral   Take 2 tablets (50 mg total) by mouth 2 (two) times daily. Patient not taking: Reported on 03/17/2015   120 tablet   0   . levothyroxine (SYNTHROID, LEVOTHROID) 112 MCG tablet   Oral   Take 112 mcg by mouth daily.         Marland Kitchen levothyroxine (SYNTHROID, LEVOTHROID) 137 MCG tablet  Oral   Take 137 mcg by mouth daily before breakfast.         . ondansetron (ZOFRAN) 4 MG tablet   Oral   Take 1 tablet (4 mg total) by mouth every 6 (six) hours. Patient not taking: Reported on 04/27/2015   12 tablet   0   . oxyCODONE-acetaminophen (PERCOCET) 10-325 MG per tablet   Oral   Take 1 tablet by mouth every 4 (four) hours as needed for pain.   84 tablet   0   . ramelteon (ROZEREM) 8 MG tablet   Oral   Take 1 tablet (8 mg total) by mouth at bedtime. Patient not taking: Reported on 03/17/2015   30 tablet   0   . topiramate (TOPAMAX) 200 MG tablet   Oral   Take 1 tablet (200 mg total) by mouth 2 (two) times daily.   60 tablet   0   . zolpidem (AMBIEN) 10 MG tablet   Oral   Take 20 mg by mouth at bedtime as needed for sleep.           Allergies Trazodone and nefazodone; Hydrocodone; Tomato; Toradol; and Tramadol  History reviewed. No pertinent family history.  Social History Social History  Substance Use Topics  .  Smoking status: Current Every Day Smoker -- 1.50 packs/day for 12 years    Types: Cigarettes  . Smokeless tobacco: None  . Alcohol Use: No    Review of Systems  Constitutional: No fever/chills Eyes: No visual changes. No discharge ENT: No sore throat. Cardiovascular: Denies chest pain. Respiratory: Denies shortness of breath. Gastrointestinal: No abdominal pain.  No nausea, no vomiting.   Genitourinary: Negative for dysuria. Musculoskeletal: Cramping and lower extremities bilaterally, chronic Skin: Negative for laceration Neurological: Negative for headaches   10-point ROS otherwise negative.  ____________________________________________   PHYSICAL EXAM:  VITAL SIGNS: ED Triage Vitals  Enc Vitals Group     BP 05/15/16 1140 126/86 mmHg     Pulse Rate 05/15/16 1140 80     Resp 05/15/16 1140 18     Temp 05/15/16 1140 97.8 F (36.6 C)     Temp Source 05/15/16 1140 Oral     SpO2 05/15/16 1140 99 %     Weight 05/15/16 1140 200 lb (90.719 kg)     Height 05/15/16 1140 5\' 6"  (1.676 m)     Head Cir --      Peak Flow --      Pain Score 05/15/16 1130 7     Pain Loc --      Pain Edu? --      Excl. in [place]? --     Constitutional: Alert and oriented. No acute distress. Pleasant and interactive Eyes: Conjunctivae are normal.  Head: Atraumatic.Normocephalic Nose: No congestion/rhinnorhea. Mouth/Throat: Mucous membranes are moist.  Oropharynx non-erythematous.  Cardiovascular: Normal rate, regular rhythm. Grossly normal heart sounds.  Good peripheral circulation. Respiratory: Normal respiratory effort.  No retractions. Lungs CTAB. Gastrointestinal: Soft and nontender. No distention.  No CVA tenderness. Genitourinary: deferred Musculoskeletal: No lower extremity tenderness nor edema.  Warm and well perfused Neurologic:  Normal speech and language. No gross focal neurologic deficits are appreciated.  Skin:  Skin is warm, dry and intact. No Lacerations Psychiatric: Mood and affect  are normal. Speech and behavior are normal.  ____________________________________________   LABS (all labs ordered are listed, but only abnormal results are displayed)  Labs Reviewed  COMPREHENSIVE METABOLIC PANEL - Abnormal; Notable for the following:  Glucose, Bld 126 (*)    Total Protein 8.3 (*)    All other components within normal limits  ACETAMINOPHEN LEVEL - Abnormal; Notable for the following:    Acetaminophen (Tylenol), Serum <10 (*)    All other components within normal limits  ETHANOL  SALICYLATE LEVEL  CBC  URINE DRUG SCREEN, QUALITATIVE ([facility] ONLY)   ____________________________________________  EKG  None ____________________________________________  RADIOLOGY  None ____________________________________________   PROCEDURES  Procedure(s) performed: No    Critical Care performed: No ____________________________________________   INITIAL IMPRESSION / ASSESSMENT AND PLAN / ED COURSE  Pertinent labs & imaging results that were available during my care of the patient were reviewed by me and considered in my medical decision making (see chart for details).  Patient presents voluntarily. Do not feel she requires involuntarily commitment at this time as she has good insight into her condition. She is depressed with suicidal ideation. I will consult TTS and psychiatry  ----------------------------------------- 1:50 PM on 05/15/2016 -----------------------------------------  Pending evaluation by behavioral medicine team ____________________________________________   FINAL CLINICAL IMPRESSION(S) / ED DIAGNOSES  Final diagnoses:  Single current episode of major depressive disorder, unspecified depression episode severity ([place])  Suicidal ideation      NEW MEDICATIONS STARTED DURING THIS VISIT:  New Prescriptions   No medications on file     Note:  This document was prepared using Dragon voice recognition software and may include  unintentional dictation errors.    Lavonia Drafts, MD 05/15/16 1351

## 2016-05-15 NOTE — ED Notes (Signed)
Nurse talked with Patient and Patient states that she got off of her medicines and now she is seeing shadows, and hearing voices, but can't make out what they are saying, she states she does feel Si, but no plan, that she has one supportive person in her life and she ask to use the phone, Patient is being monitored, 15 min.checks and camera monitoring in progress.

## 2016-05-15 NOTE — ED Notes (Signed)
Report was received Laymond Purser., RN; Pt. Verbalizes no complaints or distress; denies S.I./Hi. Continue to monitor with 15 min. Monitoring.

## 2016-05-15 NOTE — Consult Note (Signed)
Kelsey Maldonado   Reason for Maldonado:  Maldonado for this 35 year old woman with a history of mood instability and substance abuse who presents voluntarily Referring Physician:  Corky Downs Patient Identification: Kelsey Maldonado MRN:  [id number] Principal Diagnosis: Bipolar 1 disorder [facility]) Diagnosis:   Patient Active Problem List   Diagnosis Date Noted  . Bipolar 1 disorder ([place]) [F31.9] 05/15/2016  . Borderline personality disorder [F60.3] 05/15/2016  . Polysubstance abuse [F19.10] 05/15/2016  . Noncompliance [Z91.19] 05/15/2016  . Hypothyroid [E03.9] 05/15/2016  . Chronic pelvic pain in female [N94.9, G89.29] 04/27/2015  . Bipolar I disorder, most recent episode (or current) depressed, severe, without mention of psychotic behavior [F31.4] 06/05/2013    Total Time spent with patient: 1 hour  Subjective:   MARVELINE Maldonado is a 35 y.o. female patient admitted with "I need to get back on my medicine".  HPI:  Patient interviewed. Chart reviewed. Labs and vitals reviewed. Case discussed with TTS and emergency room physician. 35 year old woman with a history of bipolar disorder personality disorder and substance abuse problems presents stating she wants to get back on her medicine. She's been off her medicine for probably about 6 months or more. Has been feeling more labile and agitated. Feels like her mind is racing. Gets paranoid feelings like she doesn't want to go outside. She is not actively hallucinating right now. She says she's had some thoughts about suicide but has no specific plan or intent. She says that she took some Percocets yesterday but denies any other drug abuse. Drug screen is positive for benzodiazepines. Says that she's not been drinking recently. Patient [place] out of state for [place] months and during that time dropped out of treatment. Just recently moved back to [place]. She has a stable place to live right now.  Social history: Says she is living with an  uncle and a friend. It's a stable place. Despite this she feels a little paranoid there.  Medical history: History of hypothyroidism and she has also been noncompliant with her thyroid medicine.  Substance abuse history: History of abuse of multiple substances especially sedatives  Past Psychiatric History: Long history of mood instability and personality disorder behavior with substance abuse. Overdoses on sedative medicines and abuse of sedative medicines. Positive past history of hospitalizations and suicidal behavior although she also has a history of some outpatient compliance.  Risk to Self: Suicidal Ideation: Yes-Currently Present Suicidal Intent: No-Not Currently/Within Last 6 Months Is patient at risk for suicide?: Yes Suicidal Plan?: No (pt states no, just wants to hurt self) Access to Means: No What has been your use of drugs/alcohol within the last 12 months?: opiates How many times?: 3 (numerous per [facility]) Other Self Harm Risks: cut/ burn history Triggers for Past Attempts: None known Intentional Self Injurious Behavior: Cutting, Burning Comment - Self Injurious Behavior: hx of cut/ burn denies current Risk to Others: Homicidal Ideation: No Thoughts of Harm to Others: No Current Homicidal Intent: No Current Homicidal Plan: No Access to Homicidal Means: No Identified Victim: none given History of harm to others?: No Assessment of Violence: None Noted Violent Behavior Description: none known Does patient have access to weapons?: No Criminal Charges Pending?: No Does patient have a court date: No Prior Inpatient Therapy: Prior Inpatient Therapy: Yes Prior Therapy Dates: 2017, 2016, 2014 Prior Therapy Facilty/Provider(s): Unknown [place], [place] Reason for Treatment: medication management Prior Outpatient Therapy: Prior Outpatient Therapy: Yes Prior Therapy Dates: unknown Prior Therapy Facilty/Provider(s): unspecified Reason for Treatment: bipolar Does patient  have an  ACCT team?: No Does patient have Intensive In-House Services?  : No Does patient have [facility] services? : No Does patient have P4CC services?: No  Past Medical History:  Past Medical History  Diagnosis Date  . Thyroid disease   . Bipolar 1 disorder ([place])   . Hypothyroidism   . Depression   . Endometriosis   . Chronic pain   . HPV (human papilloma virus) anogenital infection     Past Surgical History  Procedure Laterality Date  . Molar pregnancy    . Laparoscopic hysterectomy Bilateral 04/27/2015    Procedure: HYSTERECTOMY TOTAL LAPAROSCOPIC/BIL.SALPINGECTOMY;  Surgeon: Ruffin Frederick, MD;  Location: [facility] ORS;  Service: Gynecology;  Laterality: Bilateral;  . Lysis of adhesion N/A 04/27/2015    Procedure: LYSIS OF ADHESION;  Surgeon: Ruffin Frederick, MD;  Location: [facility] ORS;  Service: Gynecology;  Laterality: N/A;   Family History: History reviewed. No pertinent family history. Family Psychiatric  History: Patient says that she does have a positive history of anxiety and depression and bipolar disorder in her family. Social History:  History  Alcohol Use No     History  Drug Use No    Social History   Social History  . Marital Status: Single    Spouse Name: N/A  . Number of Children: N/A  . Years of Education: N/A   Social History Main Topics  . Smoking status: Current Every Day Smoker -- 1.50 packs/day for 12 years    Types: Cigarettes  . Smokeless tobacco: None  . Alcohol Use: No  . Drug Use: No  . Sexual Activity: No   Other Topics Concern  . None   Social History Narrative   Additional Social History:    Allergies:   Allergies  Allergen Reactions  . Trazodone And Nefazodone Other (See Comments)    Causes her to be hyperactive and causes severe headache that lasts most of the next day.  . Hydrocodone Nausea And Vomiting    Rash/hives itching  . Tomato Swelling and Other (See Comments)    Blisters   . Toradol [Ketorolac Tromethamine] Nausea  And Vomiting  . Tramadol Nausea And Vomiting    Labs:  Results for orders placed or performed during the hospital encounter of 05/15/16 (from the past 48 hour(s))  Comprehensive metabolic panel     Status: Abnormal   Collection Time: 05/15/16 11:33 AM  Result Value Ref Range   Sodium 140 135 - 145 mmol/L   Potassium 4.1 3.5 - 5.1 mmol/L   Chloride 105 101 - 111 mmol/L   CO2 27 22 - 32 mmol/L   Glucose, Bld 126 (H) 65 - 99 mg/dL   BUN 15 6 - 20 mg/dL   Creatinine, Ser 0.75 0.44 - 1.00 mg/dL   Calcium 9.6 8.9 - 10.3 mg/dL   Total Protein 8.3 (H) 6.5 - 8.1 g/dL   Albumin 4.7 3.5 - 5.0 g/dL   AST 27 15 - 41 U/L   ALT 28 14 - 54 U/L   Alkaline Phosphatase 56 38 - 126 U/L   Total Bilirubin 0.8 0.3 - 1.2 mg/dL   GFR calc non Af Amer >60 >60 mL/min   GFR calc Af Amer >60 >60 mL/min    Comment: (NOTE) The eGFR has been calculated using the CKD EPI equation. This calculation has not been validated in all clinical situations. eGFR's persistently <60 mL/min signify possible Chronic Kidney Disease.    Anion gap 8 5 - 15  Ethanol  Status: None   Collection Time: 05/15/16 11:33 AM  Result Value Ref Range   Alcohol, Ethyl (B) <5 <5 mg/dL    Comment:        LOWEST DETECTABLE LIMIT FOR SERUM ALCOHOL IS 5 mg/dL FOR MEDICAL PURPOSES ONLY   Salicylate level     Status: None   Collection Time: 05/15/16 11:33 AM  Result Value Ref Range   Salicylate Lvl <0.7 2.8 - 30.0 mg/dL  Acetaminophen level     Status: Abnormal   Collection Time: 05/15/16 11:33 AM  Result Value Ref Range   Acetaminophen (Tylenol), Serum <10 (L) 10 - 30 ug/mL    Comment:        THERAPEUTIC CONCENTRATIONS VARY SIGNIFICANTLY. A RANGE OF 10-30 ug/mL MAY BE AN EFFECTIVE CONCENTRATION FOR MANY PATIENTS. HOWEVER, SOME ARE BEST TREATED AT CONCENTRATIONS OUTSIDE THIS RANGE. ACETAMINOPHEN CONCENTRATIONS >150 ug/mL AT 4 HOURS AFTER INGESTION AND >50 ug/mL AT 12 HOURS AFTER INGESTION ARE OFTEN ASSOCIATED WITH  TOXIC REACTIONS.   cbc     Status: None   Collection Time: 05/15/16 11:33 AM  Result Value Ref Range   WBC 7.5 3.6 - 11.0 K/uL   RBC 5.02 3.80 - 5.20 MIL/uL   Hemoglobin 16.0 12.0 - 16.0 g/dL   HCT 45.4 35.0 - 47.0 %   MCV 90.5 80.0 - 100.0 fL   MCH 31.9 26.0 - 34.0 pg   MCHC 35.2 32.0 - 36.0 g/dL   RDW 13.2 11.5 - 14.5 %   Platelets 235 150 - 440 K/uL  Urine Drug Screen, Qualitative     Status: Abnormal   Collection Time: 05/15/16 11:34 AM  Result Value Ref Range   Tricyclic, Ur Screen NONE DETECTED NONE DETECTED   Amphetamines, Ur Screen NONE DETECTED NONE DETECTED   MDMA (Ecstasy)Ur Screen NONE DETECTED NONE DETECTED   Cocaine Metabolite,Ur [place] NONE DETECTED NONE DETECTED   Opiate, Ur Screen NONE DETECTED NONE DETECTED   Phencyclidine (PCP) Ur S NONE DETECTED NONE DETECTED   Cannabinoid 50 Ng, Ur [place] NONE DETECTED NONE DETECTED   Barbiturates, Ur Screen NONE DETECTED NONE DETECTED   Benzodiazepine, Ur Scrn POSITIVE (A) NONE DETECTED   Methadone Scn, Ur NONE DETECTED NONE DETECTED    Comment: (NOTE) 680  Tricyclics, urine               Cutoff 1000 ng/mL 200  Amphetamines, urine             Cutoff 1000 ng/mL 300  MDMA (Ecstasy), urine           Cutoff 500 ng/mL 400  Cocaine Metabolite, urine       Cutoff 300 ng/mL 500  Opiate, urine                   Cutoff 300 ng/mL 600  Phencyclidine (PCP), urine      Cutoff 25 ng/mL 700  Cannabinoid, urine              Cutoff 50 ng/mL 800  Barbiturates, urine             Cutoff 200 ng/mL 900  Benzodiazepine, urine           Cutoff 200 ng/mL 1000 Methadone, urine                Cutoff 300 ng/mL 1100 1200 The urine drug screen provides only a preliminary, unconfirmed 1300 analytical test result and should not be used for non-medical 1400 purposes. Clinical consideration and professional  judgment should 1500 be applied to any positive drug screen result due to possible 1600 interfering substances. A more specific alternate chemical  method 1700 must be used in order to obtain a confirmed analytical result.  1800 Gas chromato graphy / mass spectrometry (GC/MS) is the preferred 1900 confirmatory method.     Current Facility-Administered Medications  Medication Dose Route Frequency Provider Last Rate Last Dose  . [START ON 05/16/2016] FLUoxetine (PROZAC) capsule 20 mg  20 mg Oral Daily Rudene Re, MD      . Derrill Memo ON 05/16/2016] levothyroxine (SYNTHROID, LEVOTHROID) tablet 112 mcg  112 mcg Oral QAC breakfast Rudene Re, MD      . topiramate (TOPAMAX) tablet 200 mg  200 mg Oral BID Rudene Re, MD   200 mg at 05/15/16 2154  . zolpidem (AMBIEN) tablet 10 mg  10 mg Oral QHS Rudene Re, MD   10 mg at 05/15/16 2153   Current Outpatient Prescriptions  Medication Sig Dispense Refill  . alprazolam (XANAX) 2 MG tablet Take 2 mg by mouth every 4 (four) hours.    Marland Kitchen buPROPion (WELLBUTRIN XL) 300 MG 24 hr tablet Take 300 mg by mouth daily.    Marland Kitchen FLUoxetine (PROZAC) 20 MG capsule Take 20 mg by mouth daily.    Marland Kitchen levothyroxine (SYNTHROID, LEVOTHROID) 112 MCG tablet Take 112 mcg by mouth daily.    Marland Kitchen zolpidem (AMBIEN) 10 MG tablet Take 20 mg by mouth at bedtime as needed for sleep.    Marland Kitchen buPROPion (WELLBUTRIN XL) 300 MG 24 hr tablet Take 1 tablet (300 mg total) by mouth daily. 30 tablet 1  . [START ON 05/16/2016] FLUoxetine (PROZAC) 40 MG capsule Take 1 capsule (40 mg total) by mouth daily. 30 capsule 1  . hydrOXYzine (ATARAX/VISTARIL) 50 MG tablet Take one tablet 3 x a day and then 1 at bedtime. (Patient not taking: Reported on 03/17/2015) 120 tablet 0  . lamoTRIgine (LAMICTAL) 25 MG tablet Take 2 tablets (50 mg total) by mouth 2 (two) times daily. (Patient not taking: Reported on 03/17/2015) 120 tablet 0  . [START ON 05/16/2016] levothyroxine (SYNTHROID, LEVOTHROID) 112 MCG tablet Take 1 tablet (112 mcg total) by mouth daily before breakfast. 30 tablet 1  . ramelteon (ROZEREM) 8 MG tablet Take 1 tablet (8 mg total) by  mouth at bedtime. (Patient not taking: Reported on 03/17/2015) 30 tablet 0  . topiramate (TOPAMAX) 200 MG tablet Take 1 tablet (200 mg total) by mouth 2 (two) times daily. 60 tablet 1    Musculoskeletal: Strength & Muscle Tone: within normal limits Gait & Station: normal Patient leans: N/A  Psychiatric Specialty Exam: Physical Exam  Nursing note reviewed. Constitutional: She appears well-developed and well-nourished.  HENT:  Head: Normocephalic and atraumatic.  Eyes: Conjunctivae are normal. Pupils are equal, round, and reactive to light.  Neck: Normal range of motion.  Cardiovascular: Regular rhythm and normal heart sounds.   Respiratory: Effort normal. No respiratory distress.  GI: Soft.  Musculoskeletal: Normal range of motion.  Neurological: She is alert.  Skin: Skin is warm and dry.  Psychiatric: Her speech is normal and behavior is normal. Judgment normal. Her mood appears anxious. Cognition and memory are normal. She exhibits a depressed mood. She expresses suicidal ideation. She expresses no suicidal plans.    Review of Systems  Constitutional: Negative.   HENT: Negative.   Eyes: Negative.   Respiratory: Negative.   Cardiovascular: Negative.   Gastrointestinal: Negative.   Musculoskeletal: Negative.   Skin: Negative.  Neurological: Negative.   Psychiatric/Behavioral: Positive for depression, suicidal ideas and substance abuse. Negative for hallucinations and memory loss. The patient is nervous/anxious and has insomnia.     Blood pressure 104/73, pulse 65, temperature 97.6 F (36.4 C), temperature source Oral, resp. rate 18, height 5' 6"  (1.676 m), weight 90.719 kg (200 lb), last menstrual period 12/21/2014, SpO2 99 %.Body mass index is 32.3 kg/(m^2).  General Appearance: Casual  Eye Contact:  Fair  Speech:  Clear and Coherent  Volume:  Normal  Mood:  Dysphoric  Affect:  Constricted  Thought Process:  Goal Directed  Orientation:  Full (Time, Place, and Person)   Thought Content:  Logical  Suicidal Thoughts:  Yes.  without intent/plan  Homicidal Thoughts:  No  Memory:  Immediate;   Good Recent;   Fair Remote;   Fair  Judgement:  Fair  Insight:  Fair  Psychomotor Activity:  Normal  Concentration:  Concentration: Fair  Recall:  [employer] of Knowledge:  Fair  Language:  Fair  Akathisia:  No  Handed:  Right  AIMS (if indicated):     Assets:  Communication Skills Desire for Improvement Housing Resilience  ADL's:  Intact  Cognition:  WNL  Sleep:        Treatment Plan Summary: Medication management and Plan Patient has some suicidal thoughts but has no intent or plan to act on them. She is willing to engage in appropriate treatment. We discussed her medicines. She was last stable on a combination of 40 mg of Prozac a day, 300 mg of Wellbutrin extended release per day, 200 mg of Topamax twice a day and 112 g of Synthroid per day. There is no indication to restart her on the Ambien which in fact she used to abuse. I will give her prescriptions for all of her medicine for 1 month and a refill. Patient will be referred back to [facility] and is to go back into treatment there. She is agreeable to the plan. No IVC initiated. Case reviewed with the ER doctor. Patient can be discharged.  Disposition: Patient does not meet criteria for psychiatric inpatient admission. Supportive therapy provided about ongoing stressors. Discussed crisis plan, support from social network, calling 911, coming to the Emergency Department, and calling Suicide Hotline.  Alethia Berthold, MD 05/15/2016 11:18 PM

## 2016-07-09 ENCOUNTER — Emergency Department
Admission: EM | Admit: 2016-07-09 | Discharge: 2016-07-09 | Disposition: A | Discharge status: Home / Self Care | Payer: Medicare Other | Attending: Emergency Medicine | Admitting: Emergency Medicine

## 2016-07-09 ENCOUNTER — Emergency Department: Payer: Medicare Other

## 2016-07-09 ENCOUNTER — Encounter: Payer: Self-pay | Admitting: *Deleted

## 2016-07-09 DIAGNOSIS — M25562 Pain in left knee: Secondary | ICD-10-CM | POA: Diagnosis present

## 2016-07-09 DIAGNOSIS — E039 Hypothyroidism, unspecified: Secondary | ICD-10-CM | POA: Diagnosis not present

## 2016-07-09 DIAGNOSIS — Y9289 Other specified places as the place of occurrence of the external cause: Secondary | ICD-10-CM | POA: Insufficient documentation

## 2016-07-09 DIAGNOSIS — F17210 Nicotine dependence, cigarettes, uncomplicated: Secondary | ICD-10-CM | POA: Diagnosis not present

## 2016-07-09 DIAGNOSIS — Y999 Unspecified external cause status: Secondary | ICD-10-CM | POA: Insufficient documentation

## 2016-07-09 DIAGNOSIS — W228XXA Striking against or struck by other objects, initial encounter: Secondary | ICD-10-CM | POA: Diagnosis not present

## 2016-07-09 DIAGNOSIS — Y9389 Activity, other specified: Secondary | ICD-10-CM | POA: Diagnosis not present

## 2016-07-09 DIAGNOSIS — S8002XA Contusion of left knee, initial encounter: Secondary | ICD-10-CM | POA: Diagnosis not present

## 2016-07-09 DIAGNOSIS — Z79899 Other long term (current) drug therapy: Secondary | ICD-10-CM | POA: Diagnosis not present

## 2016-07-09 MED ORDER — NAPROXEN 500 MG PO TABS
500.0000 mg | ORAL_TABLET | Freq: Once | ORAL | Status: AC
  Administered 2016-07-09: 500 mg via ORAL
  Filled 2016-07-09: qty 1

## 2016-07-09 MED ORDER — NAPROXEN 500 MG PO TABS: 500.0000 mg | ORAL_TABLET | Freq: Two times a day (BID) | ORAL | Status: DC

## 2016-07-09 NOTE — ED Provider Notes (Signed)
[facility] Emergency Department Provider Note   ____________________________________________   First MD Initiated Contact with Patient 07/09/16 1544     (approximate)  I have reviewed the triage vital signs and the nursing notes.   HISTORY  Chief Complaint Knee Pain    HPI Kelsey Maldonado is a 35 y.o. female patient complaining of bilateral knee pain left knee being worse than right. Patient states chronic condition for greater than 6 months. Patient stated 2 weeks ago she hit her left knee on a car door. Patient state only mild relief of ibuprofen. Patient rated the pain as a 9/10. Patient described the pain as "achy".   Past Medical History:  Diagnosis Date  . Bipolar 1 disorder ([place])   . Chronic pain   . Depression   . Endometriosis   . HPV (human papilloma virus) anogenital infection   . Hypothyroidism   . Thyroid disease     Patient Active Problem List   Diagnosis Date Noted  . Bipolar 1 disorder ([place]) 05/15/2016  . Borderline personality disorder 05/15/2016  . Polysubstance abuse 05/15/2016  . Noncompliance 05/15/2016  . Hypothyroid 05/15/2016  . Chronic pelvic pain in female 04/27/2015  . Bipolar I disorder, most recent episode (or current) depressed, severe, without mention of psychotic behavior 06/05/2013    Past Surgical History:  Procedure Laterality Date  . LAPAROSCOPIC HYSTERECTOMY Bilateral 04/27/2015   Procedure: HYSTERECTOMY TOTAL LAPAROSCOPIC/BIL.SALPINGECTOMY;  Surgeon: Ruffin Frederick, MD;  Location: [facility] ORS;  Service: Gynecology;  Laterality: Bilateral;  . LYSIS OF ADHESION N/A 04/27/2015   Procedure: LYSIS OF ADHESION;  Surgeon: Ruffin Frederick, MD;  Location: [facility] ORS;  Service: Gynecology;  Laterality: N/A;  . molar pregnancy      Prior to Admission medications   Medication Sig Start Date End Date Taking? Authorizing Provider  buPROPion (WELLBUTRIN XL) 300 MG 24 hr tablet Take 1 tablet (300 mg total) by  mouth daily. 05/15/16   Gonzella Lex, MD  FLUoxetine (PROZAC) 40 MG capsule Take 1 capsule (40 mg total) by mouth daily. 05/16/16   Gonzella Lex, MD  levothyroxine (SYNTHROID, LEVOTHROID) 112 MCG tablet Take 1 tablet (112 mcg total) by mouth daily before breakfast. 05/16/16   Gonzella Lex, MD  naproxen (NAPROSYN) 500 MG tablet Take 1 tablet (500 mg total) by mouth 2 (two) times daily with a meal. 07/09/16   Sable Feil, PA-C  topiramate (TOPAMAX) 200 MG tablet Take 1 tablet (200 mg total) by mouth 2 (two) times daily. 05/15/16   Gonzella Lex, MD    Allergies Trazodone and nefazodone; Hydrocodone; Tomato; Toradol [ketorolac tromethamine]; and Tramadol  History reviewed. No pertinent family history.  Social History Social History  Substance Use Topics  . Smoking status: Current Every Day Smoker    Packs/day: 1.50    Years: 12.00    Types: Cigarettes  . Smokeless tobacco: Not on file  . Alcohol use No    Review of Systems Constitutional: No fever/chills Eyes: No visual changes. ENT: No sore throat. Cardiovascular: Denies chest pain. Respiratory: Denies shortness of breath. Gastrointestinal: No abdominal pain.  No nausea, no vomiting.  No diarrhea.  No constipation. Genitourinary: Negative for dysuria. Musculoskeletal: Bilateral knee pain Skin: Negative for rash. Neurological: Negative for headaches, focal weakness or numbness. Psychiatric:Polysubstance abuse. Bipolar disorder. Depression Endocrine: Hypothyroidism Hematological/Lymphatic: Allergic/Immunilogical: See medication list ____________________________________________   PHYSICAL EXAM:  VITAL SIGNS: ED Triage Vitals  Enc Vitals Group     BP 07/09/16  1516 98/67     Pulse Rate 07/09/16 1516 65     Resp 07/09/16 1516 18     Temp 07/09/16 1516 97.8 F (36.6 C)     Temp Source 07/09/16 1516 Oral     SpO2 07/09/16 1516 100 %     Weight 07/09/16 1516 180 lb (81.6 kg)     Height 07/09/16 1516 5\' 7"  (1.702 m)       Head Circumference --      Peak Flow --      Pain Score 07/09/16 1517 9     Pain Loc --      Pain Edu? --      Excl. in [place]? --     Constitutional: Alert and oriented. Well appearing and in no acute distress. Eyes: Conjunctivae are normal. PERRL. EOMI. Head: Atraumatic. Nose: No congestion/rhinnorhea. Mouth/Throat: Mucous membranes are moist.  Oropharynx non-erythematous. Neck: No stridor.  No cervical spine tenderness to palpation. Hematological/Lymphatic/Immunilogical: No cervical lymphadenopathy. Cardiovascular: Normal rate, regular rhythm. Grossly normal heart sounds.  Good peripheral circulation. Respiratory: Normal respiratory effort.  No retractions. Lungs CTAB. Gastrointestinal: Soft and nontender. No distention. No abdominal bruits. No CVA tenderness. Musculoskeletal: No lower extremity tenderness nor edema.  No joint effusions. Neurologic:  Normal speech and language. No gross focal neurologic deficits are appreciated. No gait instability. Skin:  Skin is warm, dry and intact. No rash noted. Psychiatric: Mood and affect are normal. Speech and behavior are normal.  ____________________________________________   LABS (all labs ordered are listed, but only abnormal results are displayed)  Labs Reviewed - No data to display ____________________________________________  EKG   ____________________________________________  RADIOLOGY  No acute findings on x-ray of the left knee. ____________________________________________   PROCEDURES  Procedure(s) performed: None  Procedures  Critical Care performed: No  ____________________________________________   INITIAL IMPRESSION / ASSESSMENT AND PLAN / ED COURSE  Pertinent labs & imaging results that were available during my care of the patient were reviewed by me and considered in my medical decision making (see chart for details).  Left knee pain. Discussed negative x-ray finding with patient. Patient given  discharge care instructions. Patient given a prescription for naproxen and advised follow-up for her family doctor for continued care.  Clinical Course     ____________________________________________   FINAL CLINICAL IMPRESSION(S) / ED DIAGNOSES  Final diagnoses:  Knee contusion, left, initial encounter      NEW MEDICATIONS STARTED DURING THIS VISIT:  New Prescriptions   NAPROXEN (NAPROSYN) 500 MG TABLET    Take 1 tablet (500 mg total) by mouth 2 (two) times daily with a meal.     Note:  This document was prepared using Dragon voice recognition software and may include unintentional dictation errors.    Sable Feil, PA-C 07/09/16 [place] Quigley, MD 07/10/16 [phone]

## 2016-07-09 NOTE — ED Notes (Signed)
States she hit her left knee about 3 weeks ago  Had minor pain.. But states pain increased over the past couple of weeks  Min to no relief with Ibu

## 2016-07-09 NOTE — ED Triage Notes (Signed)
States bilateral knee pain with left knee worse, states pain has been going on for a while

## 2016-07-09 NOTE — ED Notes (Signed)
Pt discharged home after verbalizing understanding of discharge instructions; nad noted. 

## 2016-08-14 ENCOUNTER — Encounter: Payer: Self-pay | Admitting: Emergency Medicine

## 2016-08-14 ENCOUNTER — Emergency Department
Admission: EM | Admit: 2016-08-14 | Discharge: 2016-08-14 | Disposition: A | Discharge status: Other Acute Inpatient Hospital | Payer: Medicare Other | Attending: Emergency Medicine | Admitting: Emergency Medicine

## 2016-08-14 ENCOUNTER — Inpatient Hospital Stay
Admission: EM | Admit: 2016-08-14 | Discharge: 2016-08-15 | DRG: 885 | Disposition: A | Discharge status: Home / Self Care | Payer: Medicare Other | Source: Intra-hospital | Attending: Psychiatry | Admitting: Psychiatry

## 2016-08-14 DIAGNOSIS — F411 Generalized anxiety disorder: Secondary | ICD-10-CM | POA: Diagnosis present

## 2016-08-14 DIAGNOSIS — Z79899 Other long term (current) drug therapy: Secondary | ICD-10-CM | POA: Diagnosis not present

## 2016-08-14 DIAGNOSIS — R000 Tachycardia, unspecified: Secondary | ICD-10-CM | POA: Diagnosis present

## 2016-08-14 DIAGNOSIS — F17200 Nicotine dependence, unspecified, uncomplicated: Secondary | ICD-10-CM | POA: Diagnosis present

## 2016-08-14 DIAGNOSIS — R45851 Suicidal ideations: Secondary | ICD-10-CM

## 2016-08-14 DIAGNOSIS — F17210 Nicotine dependence, cigarettes, uncomplicated: Secondary | ICD-10-CM | POA: Insufficient documentation

## 2016-08-14 DIAGNOSIS — F1420 Cocaine dependence, uncomplicated: Secondary | ICD-10-CM | POA: Diagnosis present

## 2016-08-14 DIAGNOSIS — Z23 Encounter for immunization: Secondary | ICD-10-CM

## 2016-08-14 DIAGNOSIS — Z590 Homelessness: Secondary | ICD-10-CM | POA: Diagnosis not present

## 2016-08-14 DIAGNOSIS — F314 Bipolar disorder, current episode depressed, severe, without psychotic features: Secondary | ICD-10-CM | POA: Diagnosis not present

## 2016-08-14 DIAGNOSIS — F1010 Alcohol abuse, uncomplicated: Secondary | ICD-10-CM | POA: Diagnosis present

## 2016-08-14 DIAGNOSIS — G4700 Insomnia, unspecified: Secondary | ICD-10-CM | POA: Diagnosis present

## 2016-08-14 DIAGNOSIS — Z885 Allergy status to narcotic agent status: Secondary | ICD-10-CM

## 2016-08-14 DIAGNOSIS — Z9119 Patient's noncompliance with other medical treatment and regimen: Secondary | ICD-10-CM

## 2016-08-14 DIAGNOSIS — F1020 Alcohol dependence, uncomplicated: Secondary | ICD-10-CM | POA: Diagnosis present

## 2016-08-14 DIAGNOSIS — Z818 Family history of other mental and behavioral disorders: Secondary | ICD-10-CM | POA: Diagnosis not present

## 2016-08-14 DIAGNOSIS — G8929 Other chronic pain: Secondary | ICD-10-CM | POA: Diagnosis present

## 2016-08-14 DIAGNOSIS — R102 Pelvic and perineal pain: Secondary | ICD-10-CM | POA: Diagnosis present

## 2016-08-14 DIAGNOSIS — E8881 Metabolic syndrome: Secondary | ICD-10-CM | POA: Diagnosis present

## 2016-08-14 DIAGNOSIS — E039 Hypothyroidism, unspecified: Secondary | ICD-10-CM | POA: Insufficient documentation

## 2016-08-14 DIAGNOSIS — F603 Borderline personality disorder: Secondary | ICD-10-CM | POA: Diagnosis present

## 2016-08-14 DIAGNOSIS — F319 Bipolar disorder, unspecified: Principal | ICD-10-CM | POA: Diagnosis present

## 2016-08-14 LAB — URINE DRUG SCREEN, QUALITATIVE (ARMC ONLY)
Amphetamines, Ur Screen: NOT DETECTED
Barbiturates, Ur Screen: NOT DETECTED
Benzodiazepine, Ur Scrn: NOT DETECTED
CANNABINOID 50 NG, UR ~~LOC~~: NOT DETECTED
COCAINE METABOLITE, UR ~~LOC~~: POSITIVE — AB
MDMA (ECSTASY) UR SCREEN: NOT DETECTED
Methadone Scn, Ur: NOT DETECTED
OPIATE, UR SCREEN: NOT DETECTED
PHENCYCLIDINE (PCP) UR S: NOT DETECTED
TRICYCLIC, UR SCREEN: NOT DETECTED

## 2016-08-14 LAB — CBC
HEMATOCRIT: 42.2 % (ref 35.0–47.0)
HEMOGLOBIN: 14.8 g/dL (ref 12.0–16.0)
MCH: 31.4 pg (ref 26.0–34.0)
MCHC: 35.1 g/dL (ref 32.0–36.0)
MCV: 89.5 fL (ref 80.0–100.0)
Platelets: 278 10*3/uL (ref 150–440)
RBC: 4.71 MIL/uL (ref 3.80–5.20)
RDW: 14 % (ref 11.5–14.5)
WBC: 6.9 10*3/uL (ref 3.6–11.0)

## 2016-08-14 LAB — COMPREHENSIVE METABOLIC PANEL
ALK PHOS: 57 U/L (ref 38–126)
ALT: 15 U/L (ref 14–54)
AST: 16 U/L (ref 15–41)
Albumin: 3.8 g/dL (ref 3.5–5.0)
Anion gap: 5 (ref 5–15)
BUN: 17 mg/dL (ref 6–20)
CALCIUM: 9.2 mg/dL (ref 8.9–10.3)
CHLORIDE: 110 mmol/L (ref 101–111)
CO2: 27 mmol/L (ref 22–32)
CREATININE: 0.83 mg/dL (ref 0.44–1.00)
GFR calc Af Amer: >60 mL/min (ref >60)
GFR calc non Af Amer: >60 mL/min (ref >60)
GLUCOSE: 84 mg/dL (ref 65–99)
Potassium: 3.3 mmol/L — ABNORMAL LOW (ref 3.5–5.1)
SODIUM: 142 mmol/L (ref 135–145)
Total Bilirubin: 0.4 mg/dL (ref 0.3–1.2)
Total Protein: 7.2 g/dL (ref 6.5–8.1)

## 2016-08-14 LAB — SALICYLATE LEVEL: Salicylate Lvl: <4 mg/dL (ref 2.8–30.0)

## 2016-08-14 LAB — ACETAMINOPHEN LEVEL: Acetaminophen (Tylenol), Serum: <10 ug/mL — ABNORMAL LOW (ref 10–30)

## 2016-08-14 LAB — ETHANOL: Alcohol, Ethyl (B): <5 mg/dL (ref <5)

## 2016-08-14 LAB — PREGNANCY, URINE: PREG TEST UR: NEGATIVE

## 2016-08-14 LAB — TSH: TSH: 127 u[IU]/mL — ABNORMAL HIGH (ref 0.350–4.500)

## 2016-08-14 MED ORDER — FLUOXETINE HCL 20 MG PO CAPS
ORAL_CAPSULE | ORAL | Status: AC
  Administered 2016-08-14: 40 mg via ORAL
  Filled 2016-08-14: qty 2

## 2016-08-14 MED ORDER — TOPIRAMATE 100 MG PO TABS
200.0000 mg | ORAL_TABLET | Freq: Two times a day (BID) | ORAL | Status: DC
  Administered 2016-08-15: 200 mg via ORAL
  Filled 2016-08-14 (×2): qty 2

## 2016-08-14 MED ORDER — HYDROXYZINE HCL 25 MG PO TABS
ORAL_TABLET | ORAL | Status: AC
  Administered 2016-08-14: 25 mg via ORAL
  Filled 2016-08-14: qty 1

## 2016-08-14 MED ORDER — BUPROPION HCL ER (XL) 150 MG PO TB24
300.0000 mg | ORAL_TABLET | Freq: Every day | ORAL | Status: DC
  Administered 2016-08-15: 300 mg via ORAL
  Filled 2016-08-14: qty 2

## 2016-08-14 MED ORDER — TRAZODONE HCL 100 MG PO TABS: 100.0000 mg | ORAL_TABLET | Freq: Every day | ORAL | Status: DC

## 2016-08-14 MED ORDER — MAGNESIUM HYDROXIDE 400 MG/5ML PO SUSP: 30.0000 mL | Freq: Every day | ORAL | Status: DC | PRN

## 2016-08-14 MED ORDER — LEVOTHYROXINE SODIUM 112 MCG PO TABS: 112.0000 ug | ORAL_TABLET | Freq: Every day | ORAL | Status: DC

## 2016-08-14 MED ORDER — DIPHENHYDRAMINE HCL 25 MG PO CAPS
25.0000 mg | ORAL_CAPSULE | Freq: Every evening | ORAL | Status: DC | PRN
  Administered 2016-08-14: 25 mg via ORAL

## 2016-08-14 MED ORDER — ALUM & MAG HYDROXIDE-SIMETH 200-200-20 MG/5ML PO SUSP
30.0000 mL | ORAL | Status: DC | PRN
Start: 2016-08-14 — End: 2016-08-15

## 2016-08-14 MED ORDER — TOPIRAMATE 100 MG PO TABS
200.0000 mg | ORAL_TABLET | Freq: Two times a day (BID) | ORAL | Status: DC
  Administered 2016-08-14: 200 mg via ORAL

## 2016-08-14 MED ORDER — LEVOTHYROXINE SODIUM 112 MCG PO TABS
112.0000 ug | ORAL_TABLET | Freq: Every day | ORAL | Status: DC
  Administered 2016-08-15: 112 ug via ORAL
  Filled 2016-08-14 (×2): qty 1

## 2016-08-14 MED ORDER — FLUOXETINE HCL 20 MG PO CAPS
40.0000 mg | ORAL_CAPSULE | Freq: Every day | ORAL | Status: DC
  Administered 2016-08-15: 40 mg via ORAL
  Filled 2016-08-14: qty 2

## 2016-08-14 MED ORDER — HYDROXYZINE HCL 25 MG PO TABS
25.0000 mg | ORAL_TABLET | ORAL | Status: DC | PRN
  Administered 2016-08-14: 25 mg via ORAL

## 2016-08-14 MED ORDER — INFLUENZA VAC SPLIT QUAD 0.5 ML IM SUSY: 0.5000 mL | PREFILLED_SYRINGE | INTRAMUSCULAR | Status: DC

## 2016-08-14 MED ORDER — TOPIRAMATE 100 MG PO TABS
ORAL_TABLET | ORAL | Status: AC
  Administered 2016-08-14: 200 mg via ORAL
  Filled 2016-08-14: qty 2

## 2016-08-14 MED ORDER — DIPHENHYDRAMINE HCL 25 MG PO CAPS
ORAL_CAPSULE | ORAL | Status: AC
Start: 2016-08-14 — End: 2016-08-14
  Administered 2016-08-14: 25 mg via ORAL
  Filled 2016-08-14: qty 1

## 2016-08-14 MED ORDER — HYDROXYZINE HCL 25 MG PO TABS
25.0000 mg | ORAL_TABLET | ORAL | Status: DC | PRN
  Administered 2016-08-15: 25 mg via ORAL
  Filled 2016-08-14: qty 1

## 2016-08-14 MED ORDER — ACETAMINOPHEN 325 MG PO TABS: 650.0000 mg | ORAL_TABLET | Freq: Four times a day (QID) | ORAL | Status: DC | PRN

## 2016-08-14 MED ORDER — FLUOXETINE HCL 20 MG PO CAPS
40.0000 mg | ORAL_CAPSULE | Freq: Every day | ORAL | Status: DC
  Administered 2016-08-14: 40 mg via ORAL

## 2016-08-14 MED ORDER — LEVOTHYROXINE SODIUM 112 MCG PO TABS
112.0000 ug | ORAL_TABLET | Freq: Once | ORAL | Status: AC
  Administered 2016-08-14: 112 ug via ORAL
  Filled 2016-08-14: qty 1

## 2016-08-14 MED ORDER — BUPROPION HCL ER (XL) 300 MG PO TB24: 300.0000 mg | ORAL_TABLET | Freq: Every day | ORAL | Status: DC

## 2016-08-14 MED ORDER — PNEUMOCOCCAL VAC POLYVALENT 25 MCG/0.5ML IJ INJ: 0.5000 mL | INJECTION | INTRAMUSCULAR | Status: DC
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
## 2016-08-14 NOTE — ED Notes (Signed)
Pt reports that she lost her place of residence and has nowhere to go, c/o on and off SI, no plan but states "I will do it if I leave here", requesting some resources from SW for housing for after d/c.
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
## 2016-08-14 NOTE — ED Triage Notes (Signed)
Patient comes in suicidal with a plan of overdosing on pills if she could get a hold of some. Hearing voices conversations. Hx bipolar out of medications. Denies wanting to hurt others. Has tried to self medicate with drugs, anything she can get a hold of.
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
## 2016-08-14 NOTE — ED Notes (Signed)
Pt reports needing sleep medication and anxiety medication, reports that she is allergic to Trazodone and that she normally takes Ambien, Dr. Weber Cooks notified.

## 2016-08-14 NOTE — BHH Counselor (Signed)
Patient has been admitted to [facility] BMU by Dr. Weber Cooks. Attending physician will be Dr. Bary Leriche. Patient has been assigned to room 324 A by [facility] Charge Nurse [place] Miners. ER staff is aware of the admission.

## 2016-08-14 NOTE — ED Notes (Signed)
Pt is calm and cooperative this evening. Pt mood is somewhat guarded but she is pleasant and cooperative with staff. Writer oriented pt to the [place], discussed tx plan and 15 minute checks are ongoing for safety. Food and drink provided. Pt denies SI/HI and AVH at this time. Report called to Myriam Jacobson, RN in the BMU for discharge readmit.

## 2016-08-14 NOTE — ED Provider Notes (Signed)
[facility] Emergency Department Provider Note  ____________________________________________   I have reviewed the triage vital signs and the nursing notes.   HISTORY  Chief Complaint Suicidal    HPI NEHIR QIU is a 35 y.o. female who presents today complaining of thoughts of hurting herself. Denies nausea taken an overdose but states that she goes home she will.   Denies cutting or other self-harm.   Past Medical History:  Diagnosis Date  . Bipolar 1 disorder ([place])   . Chronic pain   . Depression   . Endometriosis   . HPV (human papilloma virus) anogenital infection   . Hypothyroidism   . Thyroid disease     Patient Active Problem List   Diagnosis Date Noted  . Bipolar 1 disorder ([place]) 05/15/2016  . Borderline personality disorder 05/15/2016  . Polysubstance abuse 05/15/2016  . Noncompliance 05/15/2016  . Hypothyroid 05/15/2016  . Chronic pelvic pain in female 04/27/2015  . Bipolar I disorder, most recent episode (or current) depressed, severe, without mention of psychotic behavior 06/05/2013    Past Surgical History:  Procedure Laterality Date  . LAPAROSCOPIC HYSTERECTOMY Bilateral 04/27/2015   Procedure: HYSTERECTOMY TOTAL LAPAROSCOPIC/BIL.SALPINGECTOMY;  Surgeon: Ruffin Frederick, MD;  Location: [facility] ORS;  Service: Gynecology;  Laterality: Bilateral;  . LYSIS OF ADHESION N/A 04/27/2015   Procedure: LYSIS OF ADHESION;  Surgeon: Ruffin Frederick, MD;  Location: [facility] ORS;  Service: Gynecology;  Laterality: N/A;  . molar pregnancy      Prior to Admission medications   Medication Sig Start Date End Date Taking? Authorizing Provider  buPROPion (WELLBUTRIN XL) 300 MG 24 hr tablet Take 1 tablet (300 mg total) by mouth daily. 05/15/16   Gonzella Lex, MD  FLUoxetine (PROZAC) 40 MG capsule Take 1 capsule (40 mg total) by mouth daily. 05/16/16   Gonzella Lex, MD  levothyroxine (SYNTHROID, LEVOTHROID) 112 MCG tablet Take 1 tablet  (112 mcg total) by mouth daily before breakfast. 05/16/16   Gonzella Lex, MD  naproxen (NAPROSYN) 500 MG tablet Take 1 tablet (500 mg total) by mouth 2 (two) times daily with a meal. 07/09/16   Sable Feil, PA-C  topiramate (TOPAMAX) 200 MG tablet Take 1 tablet (200 mg total) by mouth 2 (two) times daily. 05/15/16   Gonzella Lex, MD    Allergies Trazodone and nefazodone; Hydrocodone; Tomato; Toradol [ketorolac tromethamine]; and Tramadol  No family history on file.  Social History Social History  Substance Use Topics  . Smoking status: Current Every Day Smoker    Packs/day: 1.50    Years: 12.00    Types: Cigarettes  . Smokeless tobacco: Never Used  . Alcohol use No    Review of Systems Constitutional: No fever/chills Eyes: No visual changes. ENT: No sore throat. No stiff neck no neck pain Cardiovascular: Denies chest pain. Respiratory: Denies shortness of breath. Gastrointestinal:   no vomiting.  No diarrhea.  No constipation. Genitourinary: Negative for dysuria. Musculoskeletal: Negative lower extremity swelling Skin: Negative for rash. Neurological: Negative for severe headaches, focal weakness or numbness. 10-point ROS otherwise negative.  ____________________________________________   PHYSICAL EXAM:  VITAL SIGNS: ED Triage Vitals [08/14/16 1207]  Enc Vitals Group     BP 130/87     Pulse Rate 84     Resp 16     Temp 98.1 F (36.7 C)     Temp Source Oral     SpO2 99 %     Weight 180 lb (81.6 kg)  Height 5\' 6"  (1.676 m)     Head Circumference      Peak Flow      Pain Score      Pain Loc      Pain Edu?      Excl. in [place]?     Constitutional: Alert and oriented. Well appearing and in no acute distress. Eyes: Conjunctivae are normal. PERRL. EOMI. Head: Atraumatic. Nose: No congestion/rhinnorhea. Mouth/Throat: Mucous membranes are moist.  Oropharynx non-erythematous. Neck: No stridor.   Nontender with no meningismus Cardiovascular: Normal rate,  regular rhythm. Grossly normal heart sounds.  Good peripheral circulation. Respiratory: Normal respiratory effort.  No retractions. Lungs CTAB. Abdominal: Soft and nontender. No distention. No guarding no rebound Back:  There is no focal tenderness or step off.  there is no midline tenderness there are no lesions noted. there is no CVA tenderness Musculoskeletal: No lower extremity tenderness, no upper extremity tenderness. No joint effusions, no DVT signs strong distal pulses no edema Neurologic:  Normal speech and language. No gross focal neurologic deficits are appreciated.  Skin:  Skin is warm, dry and intact. No rash noted. Psychiatric: Mood and affect are flat. Speech and behavior are normal.  ____________________________________________   LABS (all labs ordered are listed, but only abnormal results are displayed)  Labs Reviewed  COMPREHENSIVE METABOLIC PANEL - Abnormal; Notable for the following:       Result Value   Potassium 3.3 (*)    All other components within normal limits  URINE DRUG SCREEN, QUALITATIVE ([facility] ONLY) - Abnormal; Notable for the following:    Cocaine Metabolite,Ur [place] POSITIVE (*)    All other components within normal limits  CBC  ETHANOL  SALICYLATE LEVEL  ACETAMINOPHEN LEVEL   ____________________________________________  EKG  I personally interpreted any EKGs ordered by me or triage  ____________________________________________  RADIOLOGY  I reviewed any imaging ordered by me or triage that were performed during my shift and, if possible, patient and/or family made aware of any abnormal findings. ____________________________________________   PROCEDURES  Procedure(s) performed: None  Procedures  Critical Care performed: None  ____________________________________________   INITIAL IMPRESSION / ASSESSMENT AND PLAN / ED COURSE  Pertinent labs & imaging results that were available during my care of the patient were reviewed by me and  considered in my medical decision making (see chart for details).  Patient here for suicidal thoughts we will have her evaluated by psychiatry.  Clinical Course   ____________________________________________   FINAL CLINICAL IMPRESSION(S) / ED DIAGNOSES  Final diagnoses:  None      This chart was dictated using voice recognition software.  Despite best efforts to proofread,  errors can occur which can change meaning.      Schuyler Amor, MD 08/14/16 [phone]

## 2016-08-14 NOTE — Tx Team (Signed)
Initial Treatment Plan 08/14/2016 11:14 PM [place] [id number]    PATIENT STRESSORS: Financial difficulties Legal issue Medication change or noncompliance Occupational concerns Substance abuse Traumatic event   PATIENT STRENGTHS: Curator fund of knowledge Motivation for treatment/growth   PATIENT IDENTIFIED PROBLEMS: "to get back on my medicines"  "to get into the women's resource center."  Suicidal ideations  Substance abuse               DISCHARGE CRITERIA:  Improved stabilization in mood, thinking, and/or behavior  PRELIMINARY DISCHARGE PLAN: Outpatient therapy  PATIENT/FAMILY INVOLVEMENT: This treatment plan has been presented to and reviewed with the patient, Kelsey Maldonado, and/or family member.  The patient and family have been given the opportunity to ask questions and make suggestions.  Derrill Kay, RN 08/14/2016, 11:14 PM

## 2016-08-14 NOTE — ED Provider Notes (Signed)
-----------------------------------------   10:16 PM on 08/14/2016 -----------------------------------------  Patient has been seen and evaluated by psychiatry. They believe the patient is in need of further treatment and they will be admitting to their service.   Harvest Dark, MD 08/14/16 2216

## 2016-08-15 ENCOUNTER — Encounter: Payer: Self-pay | Admitting: Psychiatry

## 2016-08-15 DIAGNOSIS — F17200 Nicotine dependence, unspecified, uncomplicated: Secondary | ICD-10-CM | POA: Diagnosis present

## 2016-08-15 DIAGNOSIS — F319 Bipolar disorder, unspecified: Principal | ICD-10-CM

## 2016-08-15 LAB — LIPID PANEL
Cholesterol: 169 mg/dL (ref 0–200)
HDL: 66 mg/dL (ref >40)
LDL CALC: 87 mg/dL (ref 0–99)
TRIGLYCERIDES: 81 mg/dL (ref <150)
Total CHOL/HDL Ratio: 2.6 RATIO
VLDL: 16 mg/dL (ref 0–40)

## 2016-08-15 MED ORDER — BUPROPION HCL ER (XL) 300 MG PO TB24: 300.0000 mg | ORAL_TABLET | Freq: Every day | ORAL | 1 refills | Status: DC

## 2016-08-15 MED ORDER — ZOLPIDEM TARTRATE 5 MG PO TABS: 5.0000 mg | ORAL_TABLET | Freq: Every evening | ORAL | Status: DC | PRN

## 2016-08-15 MED ORDER — FLUOXETINE HCL 40 MG PO CAPS: 40.0000 mg | ORAL_CAPSULE | Freq: Every day | ORAL | 1 refills | Status: DC

## 2016-08-15 MED ORDER — LEVOTHYROXINE SODIUM 112 MCG PO TABS: 112.0000 ug | ORAL_TABLET | Freq: Every day | ORAL | 1 refills | Status: DC

## 2016-08-15 MED ORDER — HYDROXYZINE HCL 25 MG PO TABS: 25.0000 mg | ORAL_TABLET | Freq: Three times a day (TID) | ORAL | 1 refills | Status: DC | PRN

## 2016-08-15 MED ORDER — ZOLPIDEM TARTRATE 5 MG PO TABS: 5.0000 mg | ORAL_TABLET | Freq: Every evening | ORAL | 0 refills | Status: DC | PRN

## 2016-08-15 MED ORDER — PNEUMOCOCCAL VAC POLYVALENT 25 MCG/0.5ML IJ INJ: 0.5000 mL | INJECTION | INTRAMUSCULAR | Status: DC

## 2016-08-15 MED ORDER — INFLUENZA VAC SPLIT QUAD 0.5 ML IM SUSY: 0.5000 mL | PREFILLED_SYRINGE | INTRAMUSCULAR | Status: DC

## 2016-08-15 MED ORDER — NICOTINE 21 MG/24HR TD PT24
21.0000 mg | MEDICATED_PATCH | Freq: Every day | TRANSDERMAL | Status: DC
  Administered 2016-08-15: 21 mg via TRANSDERMAL
  Filled 2016-08-15: qty 1

## 2016-08-15 MED ORDER — TOPIRAMATE 200 MG PO TABS: 200.0000 mg | ORAL_TABLET | Freq: Two times a day (BID) | ORAL | 1 refills | Status: DC

## 2016-08-15 NOTE — BHH Suicide Risk Assessment (Signed)
[place] INPATIENT:  Family/Significant Other Suicide Prevention Education  Suicide Prevention Education:  Patient Refusal for Family/Significant Other Suicide Prevention Education: The patient Kelsey Maldonado has refused to provide written consent for family/significant other to be provided Family/Significant Other Suicide Prevention Education during admission and/or prior to discharge.  Physician notified.  Emilie Rutter, MSW, LCSW-A 08/15/2016, 11:22 AM
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
## 2016-08-15 NOTE — Progress Notes (Signed)
EKG performed.

## 2016-08-15 NOTE — Progress Notes (Signed)
  [facility] Adult Case Management Discharge Plan :  Will you be returning to the same living situation after discharge:  No. At discharge, do you have transportation home?: Yes,  cab. Do you have the ability to pay for your medications: No.  Release of information consent forms completed and in the chart;  Patient's signature needed at discharge.  Patient to Follow up at: Follow-up Information    [facility]. Go on 08/17/2016.   Why:  Please arrive to the walk-in clinic between the hours of 8am-2:30pm for an assessment for medication management and therapy.  Arrive as early as possible for prompt service. Please call Sherrian Divers at [phone] for questions and assistance. Contact information: [place] [address] Ph: [phone] Fax: [phone]          Next level of care provider has access to [place] and Suicide Prevention discussed: Yes,  SPE completed with patient.  Have you used any form of tobacco in the last 30 days? (Cigarettes, Smokeless Tobacco, Cigars, and/or Pipes): Yes  Has patient been referred to the Quitline?: Patient refused referral  Patient has been referred for addiction treatment: Pt. refused referral  Emilie Rutter, MSW, LCSW-A 08/15/2016, 11:23 AM

## 2016-08-15 NOTE — BHH Counselor (Signed)
Patient was not able to participate in assessment, discharged within 24 hours of being admitted to BMU.     Glorious Peach, MSW, LCSW-A 08/15/2016, 11:02AM
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
## 2016-08-15 NOTE — BHH Group Notes (Signed)
[place] LCSW Group Therapy   08/15/2016  9:30 am   Type of Therapy: Group Therapy   Participation Level: Active   Participation Quality: Attentive, Sharing and Supportive   Affect: Apprppriate   Cognitive: Alert and Oriented   Insight: Developing/Improving and Engaged   Engagement in Therapy: Developing/Improving and Engaged   Modes of Intervention: Clarification, Confrontation, Discussion, Education, Exploration, Limit-setting, Orientation, Problem-solving, Rapport Building, Art therapist, Socialization and Support   Summary of Progress/Problems: The topic for group today was emotional regulation. This group focused on both positive and negative emotion identification and allowed  group members to process ways to identify feelings, regulate negative emotions, and find healthy ways to manage internal/external emotions. Group members were asked to reflect on a time when their reaction to an emotion led to a negative outcome and explored how alternative responses using emotion regulation would have benefited them. Group members were also asked to discuss a time when emotion regulation was utilized when a negative emotion was experienced. Pt reported the pt has been experiencing negative emotions related to the difficulty she is having with never having met her granddaughter due to her use of substances and alcohol.  Pt states the pt utilizes healthy coping mechanisms for regulating the pt's emotions, such as hoping there is a better future  In sobriety but that the pt is also open to suggestions from the group and the CSW.  Pt described a specific situation, stating she is depressed about her family distancing themselves from her, but that she is coping well and has hope.  Pt was polite and cooperative with the CSW and other group members and focused and attentive to the topics discussed and the sharing of others.      Alphonse Guild. Teniya Filter, MSW, LCSWA, LCAS

## 2016-08-15 NOTE — Progress Notes (Signed)
Patient discharged home. DC instructions provided and explained. Medications reviewed. Rx given, 7 day supply medications given. Belongings returned. Denies SI, HI, AVH. Pt hopeful at discharge.

## 2016-08-16 LAB — PROLACTIN: PROLACTIN: 20 ng/mL (ref 4.8–23.3)

## 2016-08-16 LAB — HEMOGLOBIN A1C
Hgb A1c MFr Bld: 5 % (ref 4.8–5.6)
MEAN PLASMA GLUCOSE: 97 mg/dL

## 2016-09-20 ENCOUNTER — Telehealth: Payer: Self-pay | Admitting: Pharmacy Technician

## 2016-09-23 ENCOUNTER — Encounter: Payer: Self-pay | Admitting: Emergency Medicine

## 2016-09-23 ENCOUNTER — Emergency Department
Admission: EM | Admit: 2016-09-23 | Discharge: 2016-09-23 | Disposition: A | Discharge status: Home / Self Care | Payer: Medicare Other | Attending: Emergency Medicine | Admitting: Emergency Medicine

## 2016-09-23 ENCOUNTER — Emergency Department: Payer: Medicare Other

## 2016-09-23 DIAGNOSIS — W010XXA Fall on same level from slipping, tripping and stumbling without subsequent striking against object, initial encounter: Secondary | ICD-10-CM | POA: Diagnosis not present

## 2016-09-23 DIAGNOSIS — Z79899 Other long term (current) drug therapy: Secondary | ICD-10-CM | POA: Diagnosis not present

## 2016-09-23 DIAGNOSIS — Y9301 Activity, walking, marching and hiking: Secondary | ICD-10-CM | POA: Diagnosis not present

## 2016-09-23 DIAGNOSIS — S7002XA Contusion of left hip, initial encounter: Secondary | ICD-10-CM | POA: Insufficient documentation

## 2016-09-23 DIAGNOSIS — F17210 Nicotine dependence, cigarettes, uncomplicated: Secondary | ICD-10-CM | POA: Insufficient documentation

## 2016-09-23 DIAGNOSIS — S8002XA Contusion of left knee, initial encounter: Secondary | ICD-10-CM | POA: Insufficient documentation

## 2016-09-23 DIAGNOSIS — S9002XA Contusion of left ankle, initial encounter: Secondary | ICD-10-CM | POA: Diagnosis not present

## 2016-09-23 DIAGNOSIS — Y929 Unspecified place or not applicable: Secondary | ICD-10-CM | POA: Diagnosis not present

## 2016-09-23 DIAGNOSIS — Y999 Unspecified external cause status: Secondary | ICD-10-CM | POA: Insufficient documentation

## 2016-09-23 DIAGNOSIS — W19XXXA Unspecified fall, initial encounter: Secondary | ICD-10-CM

## 2016-09-23 DIAGNOSIS — E039 Hypothyroidism, unspecified: Secondary | ICD-10-CM | POA: Insufficient documentation

## 2016-09-23 DIAGNOSIS — S79912A Unspecified injury of left hip, initial encounter: Secondary | ICD-10-CM | POA: Diagnosis present

## 2016-09-23 MED ORDER — IBUPROFEN 800 MG PO TABS
ORAL_TABLET | ORAL | Status: AC
  Administered 2016-09-23: 800 mg via ORAL
  Filled 2016-09-23: qty 1

## 2016-09-23 MED ORDER — IBUPROFEN 800 MG PO TABS: 800.0000 mg | ORAL_TABLET | Freq: Three times a day (TID) | ORAL | 0 refills | Status: DC | PRN

## 2016-09-23 MED ORDER — IBUPROFEN 800 MG PO TABS
800.0000 mg | ORAL_TABLET | Freq: Once | ORAL | Status: AC
  Administered 2016-09-23: 800 mg via ORAL
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
## 2016-09-23 NOTE — ED Provider Notes (Signed)
[facility] Emergency Department Provider Note  ____________________________________________  Time seen: Approximately 4:39 PM  I have reviewed the triage vital signs and the nursing notes.   HISTORY  Chief Complaint Fall    HPI Kelsey Maldonado is a 35 y.o. female who presents emergency department via EMS status post fall. Patient is complaining of severe left hip, femur, knee, ankle pain. Patient states that she was walking when her ankle rolled and she took a "hard fall" on her left lower extremity. Patient reports that any movement or palpation results in severe pain. She did not hit her head or lose consciousness. Per EMS, patient fell on soft, grassy area. They deny any deformity upon their arrival. No medications prior to arrival.   Past Medical History:  Diagnosis Date  . Bipolar 1 disorder ([place])   . Chronic pain   . Depression   . Endometriosis   . HPV (human papilloma virus) anogenital infection   . Hypothyroidism   . Thyroid disease     Patient Active Problem List   Diagnosis Date Noted  . Tobacco use disorder 08/15/2016  . Cocaine use disorder, moderate, dependence ([place]) 08/14/2016  . Alcohol use disorder, mild, abuse 08/14/2016  . Bipolar 1 disorder, depressed ([place]) 08/14/2016  . Borderline personality disorder 05/15/2016  . Noncompliance 05/15/2016  . Hypothyroid 05/15/2016  . Chronic pelvic pain in female 04/27/2015    Past Surgical History:  Procedure Laterality Date  . LAPAROSCOPIC HYSTERECTOMY Bilateral 04/27/2015   Procedure: HYSTERECTOMY TOTAL LAPAROSCOPIC/BIL.SALPINGECTOMY;  Surgeon: Ruffin Frederick, MD;  Location: [facility] ORS;  Service: Gynecology;  Laterality: Bilateral;  . LYSIS OF ADHESION N/A 04/27/2015   Procedure: LYSIS OF ADHESION;  Surgeon: Ruffin Frederick, MD;  Location: [facility] ORS;  Service: Gynecology;  Laterality: N/A;  . molar pregnancy      Prior to Admission medications   Medication Sig Start Date End  Date Taking? Authorizing Provider  buPROPion (WELLBUTRIN XL) 300 MG 24 hr tablet Take 1 tablet (300 mg total) by mouth daily. 08/15/16   Clovis Fredrickson, MD  FLUoxetine (PROZAC) 40 MG capsule Take 1 capsule (40 mg total) by mouth daily. 08/15/16   Clovis Fredrickson, MD  hydrOXYzine (ATARAX/VISTARIL) 25 MG tablet Take 1 tablet (25 mg total) by mouth every 8 (eight) hours as needed for anxiety. 08/15/16   Clovis Fredrickson, MD  ibuprofen (ADVIL,MOTRIN) 800 MG tablet Take 1 tablet (800 mg total) by mouth every 8 (eight) hours as needed. 09/23/16   Charline Bills Cuthriell, PA-C  levothyroxine (SYNTHROID, LEVOTHROID) 112 MCG tablet Take 1 tablet (112 mcg total) by mouth daily before breakfast. 08/15/16   Jolanta B Pucilowska, MD  topiramate (TOPAMAX) 200 MG tablet Take 1 tablet (200 mg total) by mouth 2 (two) times daily. 08/15/16   Clovis Fredrickson, MD  zolpidem (AMBIEN) 5 MG tablet Take 1 tablet (5 mg total) by mouth at bedtime as needed for sleep. 08/15/16   Clovis Fredrickson, MD    Allergies Trazodone and nefazodone; Hydrocodone; Tomato; Toradol [ketorolac tromethamine]; and Tramadol  History reviewed. No pertinent family history.  Social History Social History  Substance Use Topics  . Smoking status: Current Every Day Smoker    Packs/day: 1.50    Years: 12.00    Types: Cigarettes  . Smokeless tobacco: Never Used  . Alcohol use No     Review of Systems  Constitutional: No fever/chills Eyes: No visual changes.  Cardiovascular: no chest pain. Respiratory: no cough. No SOB.  Gastrointestinal: No abdominal pain.  No nausea, no vomiting. Musculoskeletal: As above for left hip, left knee, left ankle pain. Skin: Negative for rash, abrasions, lacerations, ecchymosis. Neurological: Negative for headaches, focal weakness or numbness. 10-point ROS otherwise negative.  ____________________________________________   PHYSICAL EXAM:  VITAL SIGNS: ED Triage Vitals  Enc Vitals  Group     BP 09/23/16 1634 110/70     Pulse Rate 09/23/16 1634 86     Resp 09/23/16 1634 20     Temp 09/23/16 1634 98.1 F (36.7 C)     Temp Source 09/23/16 1634 Oral     SpO2 09/23/16 1632 98 %     Weight 09/23/16 1635 170 lb (77.1 kg)     Height 09/23/16 1635 5\' 6"  (1.676 m)     Head Circumference --      Peak Flow --      Pain Score 09/23/16 1635 10     Pain Loc --      Pain Edu? --      Excl. in [place]? --      Constitutional: Alert and oriented. Well appearing and in no acute distress. Eyes: Conjunctivae are normal. PERRL. EOMI. Head: Atraumatic. Neck: No stridor.  No cervical spine tenderness to palpation.  Cardiovascular: Normal rate, regular rhythm. Normal S1 and S2.  Good peripheral circulation. Respiratory: Normal respiratory effort without tachypnea or retractions. Lungs CTAB. Good air entry to the bases with no decreased or absent breath sounds. Musculoskeletal: Full range of motion to all extremities. No gross deformities appreciated. No edema or ecchymosis noted to any joint. Patient is diffusely tender to palpation over all aspects of the left hip, left knee, left ankle. Full passive range of motion. Examination of the lumbar spine is unremarkable. No palpable abnormality to the left hip, left knee, left ankle. Dorsalis pedis pulse intact distally. Sensation is intact and equal to the unaffected extremity. Neurologic:  Normal speech and language. No gross focal neurologic deficits are appreciated.  Skin:  Skin is warm, dry and intact. No rash noted. Psychiatric: Mood and affect are normal. Speech and behavior are normal. Patient exhibits appropriate insight and judgement.   ____________________________________________   LABS (all labs ordered are listed, but only abnormal results are displayed)  Labs Reviewed - No data to display ____________________________________________  EKG   ____________________________________________  RADIOLOGY Diamantina Providence Cuthriell,  personally viewed and evaluated these images (plain radiographs) as part of my medical decision making, as well as reviewing the written report by the radiologist.  Dg Pelvis 1-2 Views  Result Date: 09/23/2016 CLINICAL DATA:  Pain following fall EXAM: PELVIS - 1-2 VIEW COMPARISON:  None. FINDINGS: There is no evidence of pelvic fracture or dislocation. Hip joints and sacroiliac joints appear symmetric and unremarkable bilaterally. No erosive change. IMPRESSION: No fracture or dislocation.  No evident arthropathy. Electronically Signed   By: Lowella Grip III M.D.   On: 09/23/2016 17:20   Dg Ankle Complete Left  Result Date: 09/23/2016 CLINICAL DATA:  Pain following fall EXAM: LEFT ANKLE COMPLETE - 3+ VIEW COMPARISON:  None. FINDINGS: Frontal, oblique, and lateral views were obtained. There is soft tissue swelling. There is evidence of old trauma along medial malleolar region with well corticated borders along the area of apparent old avulsion in this area. No acute fracture evident. No joint effusion. The ankle mortise appears intact. No appreciable joint space narrowing. IMPRESSION: Evidence of old healed avulsion injury in the medial malleolus. Soft tissue swelling evident. No acute fracture evident.  Ankle mortise appears intact. Electronically Signed   By: Lowella Grip III M.D.   On: 09/23/2016 17:17   Dg Knee Complete 4 Views Left  Result Date: 09/23/2016 CLINICAL DATA:  Pain following fall EXAM: LEFT KNEE - COMPLETE 4+ VIEW COMPARISON:  July 09, 2016 FINDINGS: Frontal, lateral, and bilateral oblique views were obtained. There is no appreciable fracture or dislocation. No joint effusion. The joint spaces appear normal. No erosive change. Note that there is bony overgrowth along inferior aspect of the patella, a stable and benign-appearing finding. IMPRESSION: No evident fracture or dislocation. No joint effusion. No apparent arthropathy. Electronically Signed   By: Lowella Grip  III M.D.   On: 09/23/2016 17:19   Dg Femur Min 2 Views Left  Result Date: 09/23/2016 CLINICAL DATA:  Pain following fall EXAM: LEFT FEMUR 2 VIEWS COMPARISON:  None. FINDINGS: Frontal lateral views were obtained. No fracture or dislocation. Joint spaces appear unremarkable. No erosive change. IMPRESSION: No fracture or dislocation.  No evident arthropathy. Electronically Signed   By: Lowella Grip III M.D.   On: 09/23/2016 17:20    ____________________________________________    PROCEDURES  Procedure(s) performed:    Procedures    Medications - No data to display   ____________________________________________   INITIAL IMPRESSION / ASSESSMENT AND PLAN / ED COURSE  Pertinent labs & imaging results that were available during my care of the patient were reviewed by me and considered in my medical decision making (see chart for details).  Review of the [place] CSRS was performed in accordance of the [place] prior to dispensing any controlled drugs.  Clinical Course     Patient's diagnosis is consistent with Fall resulting in hip, knee, ankle contusion. X-ray reveals no acute osseous abnormality. Exam is reassuring.. Patient will be discharged home with prescriptions for anti-inflammatories for symptom control. Patient is to follow up with [facility] as needed or otherwise directed. Patient is given ED precautions to return to the ED for any worsening or new symptoms.     ____________________________________________  FINAL CLINICAL IMPRESSION(S) / ED DIAGNOSES  Final diagnoses:  Fall, initial encounter  Contusion of left hip, initial encounter  Contusion of left knee, initial encounter  Contusion of left ankle, initial encounter      NEW MEDICATIONS STARTED DURING THIS VISIT:  New Prescriptions   IBUPROFEN (ADVIL,MOTRIN) 800 MG TABLET    Take 1 tablet (800 mg total) by mouth every 8 (eight) hours as needed.        This chart was dictated using voice recognition  software/Dragon. Despite best efforts to proofread, errors can occur which can change the meaning. Any change was purely unintentional.    Darletta Moll, PA-C 09/23/16 1741    Harvest Dark, MD 09/23/16 2207

## 2016-09-23 NOTE — ED Triage Notes (Signed)
Pt presents to ED via [facility] with c/o trip and fall. Per EMS pt denies any beck pain at this time. EMs reports pt c/o L hip, L knee, and L ankle pain at this time, EMS states no deformity at this time. Per EMS pt ran out of hydrocodone (which pt takes for endometriosis) approx 2 weeks ago. EMS reports VSS and pt alert and oriented.

## 2016-10-19 ENCOUNTER — Other Ambulatory Visit: Payer: Self-pay | Admitting: Psychiatry

## 2016-10-19 ENCOUNTER — Telehealth: Payer: Self-pay

## 2016-10-19 NOTE — Telephone Encounter (Signed)
Medication management - Fax received from [facility] at [phone], [place], [place] requesting a refill of patient's prescribed Levothyroxine.

## 2016-10-19 NOTE — Telephone Encounter (Signed)
Patient has never been an outpatient of mine.

## 2016-11-22 ENCOUNTER — Other Ambulatory Visit: Payer: Self-pay | Admitting: Psychiatry

## 2016-12-29 IMAGING — CR DG KNEE COMPLETE 4+V*L*
1 series · 4 of 4 positions shown · non-contrast
Comparison: July 09, 2016

CLINICAL DATA: Pain following fall

EXAM:
LEFT KNEE - COMPLETE 4+ VIEW

[Series 1: dg knee complete 4 views left · 0.14mm/px · 4 of 4 slices shown]
[im 1/4]
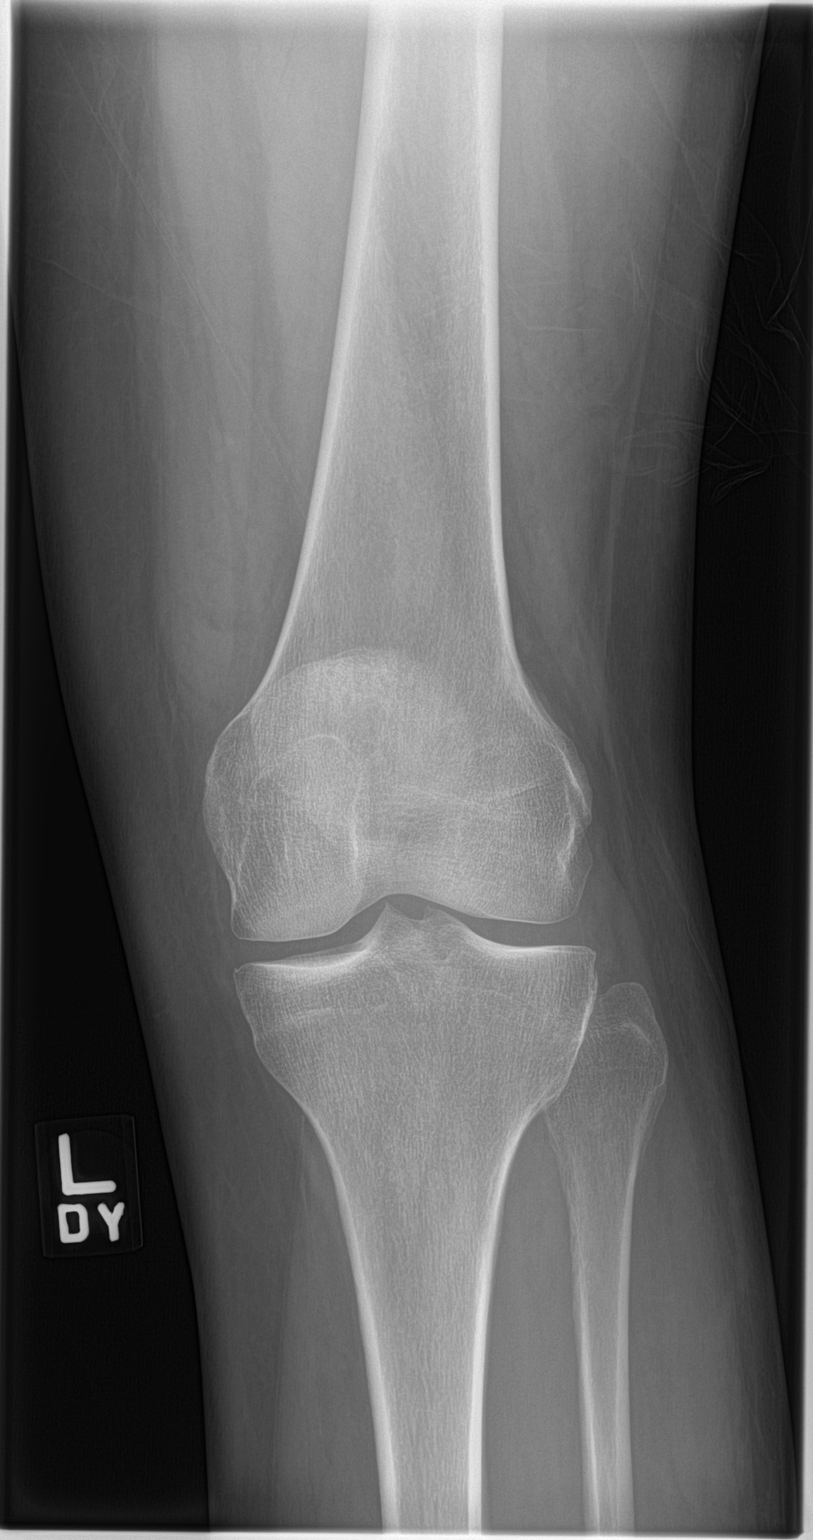
[im 2/4]
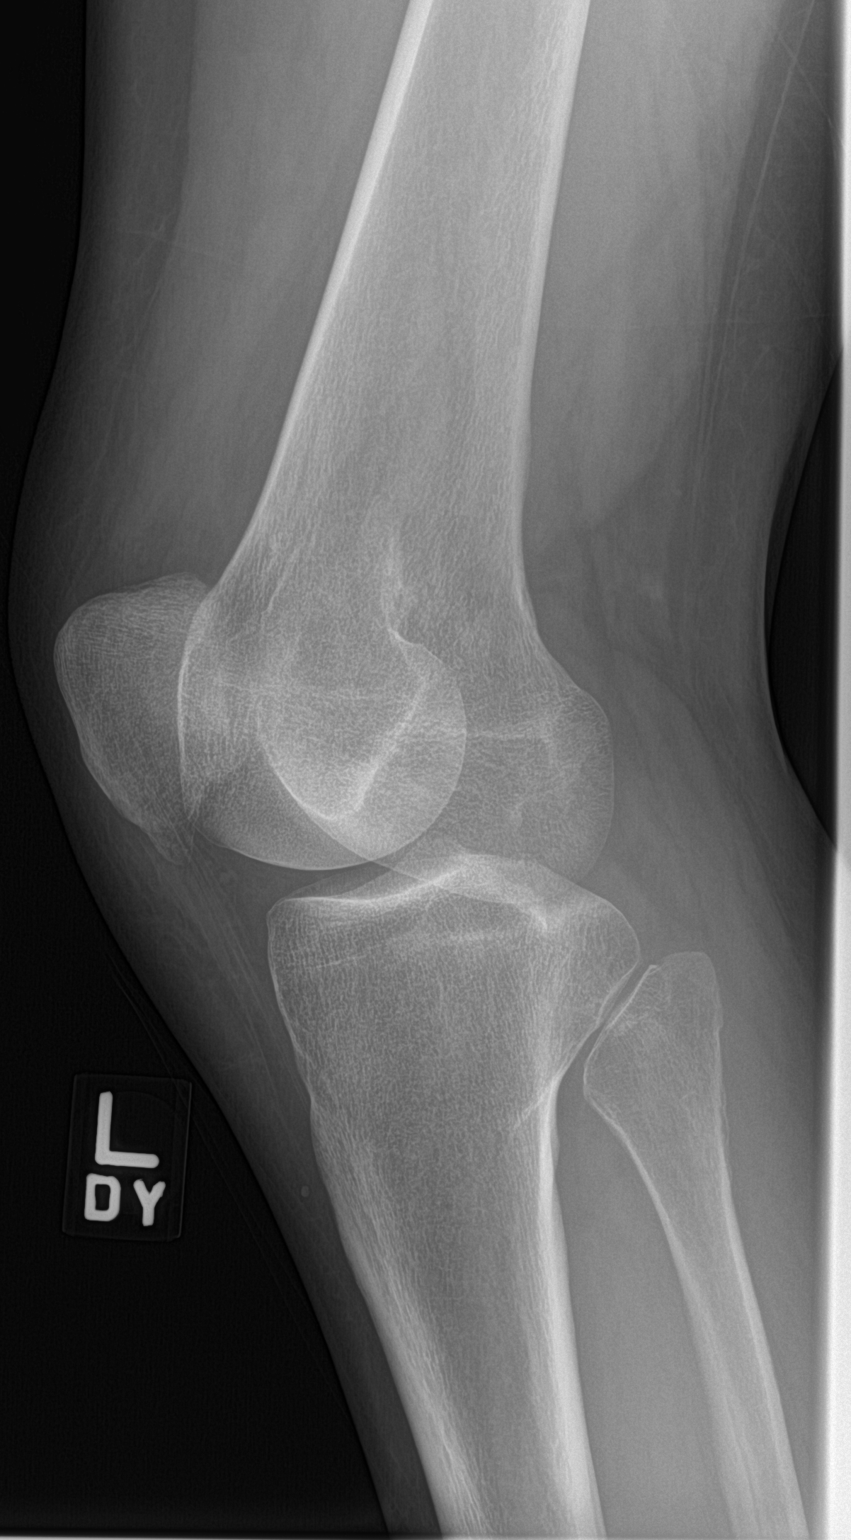
[im 3/4]
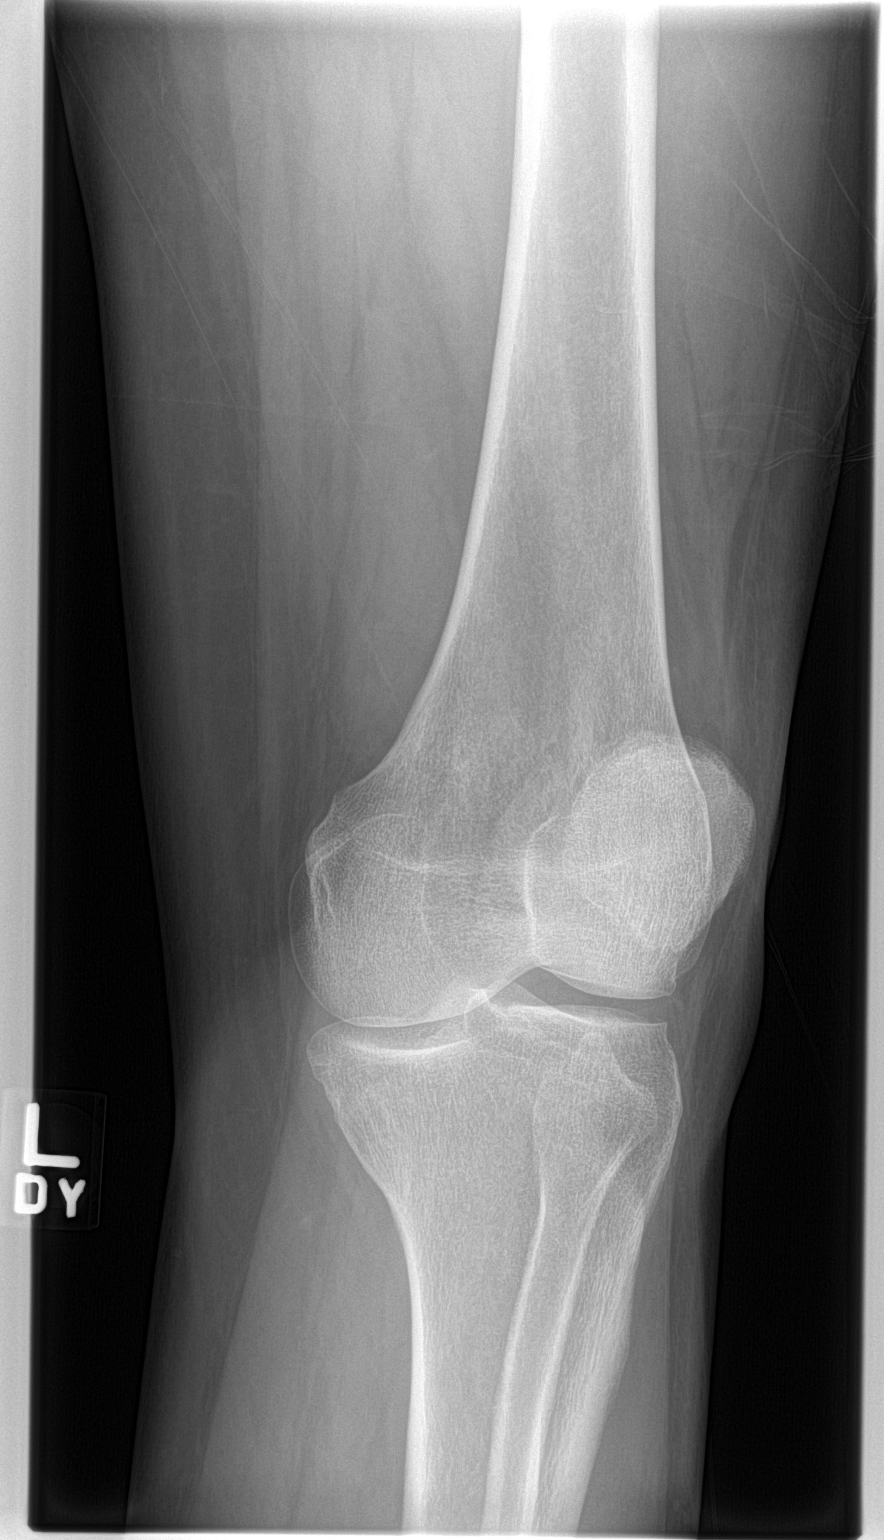
[im 4/4]
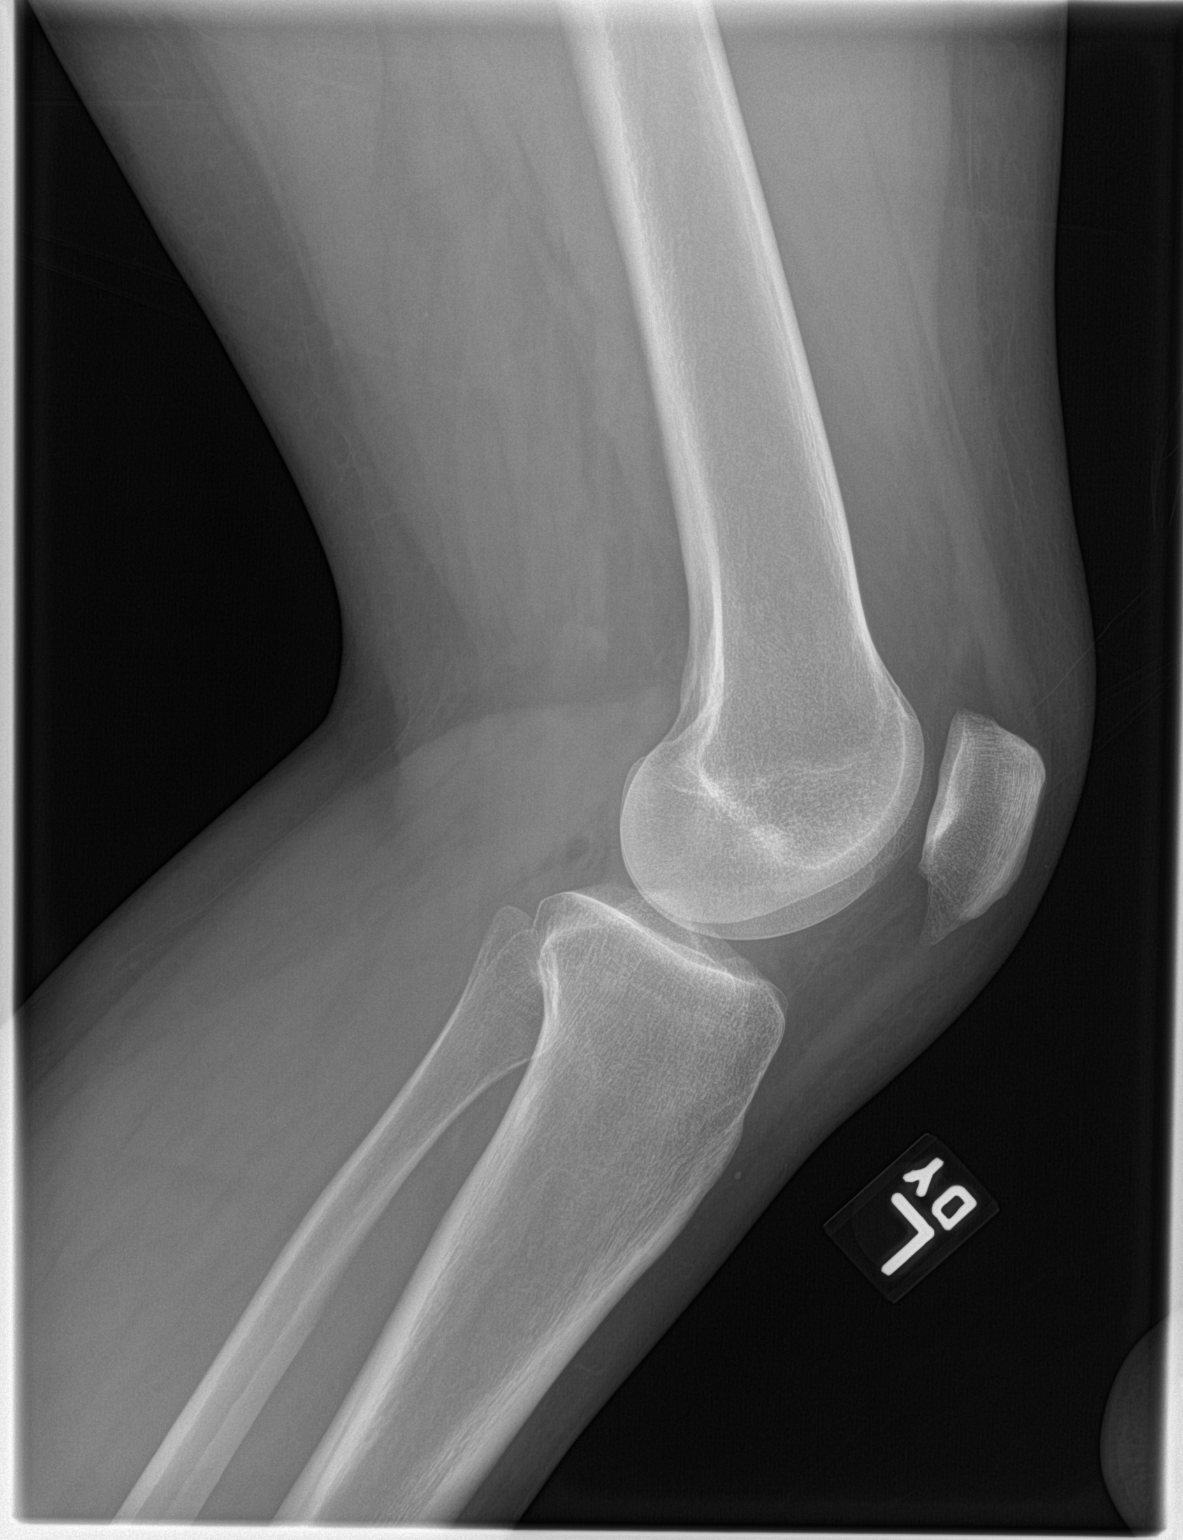

[4 of 4 positions shown; findings below may reference images not displayed]

FINDINGS: Frontal, lateral, and bilateral oblique views were obtained. There
is no appreciable fracture or dislocation. No joint effusion. The
joint spaces appear normal. No erosive change. Note that there is
bony overgrowth along inferior aspect of the patella, a stable and
benign-appearing finding.
IMPRESSION: No evident fracture or dislocation. No joint effusion. No apparent
arthropathy.

## 2017-01-25 ENCOUNTER — Emergency Department
Admission: EM | Admit: 2017-01-25 | Discharge: 2017-01-25 | Disposition: A | Discharge status: Home / Self Care | Payer: Medicare Other | Attending: Emergency Medicine | Admitting: Emergency Medicine

## 2017-01-25 DIAGNOSIS — Z79899 Other long term (current) drug therapy: Secondary | ICD-10-CM | POA: Diagnosis not present

## 2017-01-25 DIAGNOSIS — T426X1A Poisoning by other antiepileptic and sedative-hypnotic drugs, accidental (unintentional), initial encounter: Secondary | ICD-10-CM | POA: Insufficient documentation

## 2017-01-25 DIAGNOSIS — E039 Hypothyroidism, unspecified: Secondary | ICD-10-CM | POA: Diagnosis present

## 2017-01-25 DIAGNOSIS — F17210 Nicotine dependence, cigarettes, uncomplicated: Secondary | ICD-10-CM | POA: Insufficient documentation

## 2017-01-25 DIAGNOSIS — T50904A Poisoning by unspecified drugs, medicaments and biological substances, undetermined, initial encounter: Secondary | ICD-10-CM

## 2017-01-25 DIAGNOSIS — F603 Borderline personality disorder: Secondary | ICD-10-CM | POA: Diagnosis not present

## 2017-01-25 DIAGNOSIS — F1410 Cocaine abuse, uncomplicated: Secondary | ICD-10-CM | POA: Insufficient documentation

## 2017-01-25 DIAGNOSIS — F1420 Cocaine dependence, uncomplicated: Secondary | ICD-10-CM | POA: Diagnosis present

## 2017-01-25 DIAGNOSIS — F1020 Alcohol dependence, uncomplicated: Secondary | ICD-10-CM | POA: Diagnosis present

## 2017-01-25 LAB — COMPREHENSIVE METABOLIC PANEL
ALT: 23 U/L (ref 14–54)
AST: 33 U/L (ref 15–41)
Albumin: 4.4 g/dL (ref 3.5–5.0)
Alkaline Phosphatase: 55 U/L (ref 38–126)
Anion gap: 7 (ref 5–15)
BUN: 15 mg/dL (ref 6–20)
CALCIUM: 8.7 mg/dL — AB (ref 8.9–10.3)
CHLORIDE: 102 mmol/L (ref 101–111)
CO2: 25 mmol/L (ref 22–32)
Creatinine, Ser: 0.91 mg/dL (ref 0.44–1.00)
GFR calc non Af Amer: >60 mL/min (ref >60)
Glucose, Bld: 77 mg/dL (ref 65–99)
Potassium: 3.1 mmol/L — ABNORMAL LOW (ref 3.5–5.1)
SODIUM: 134 mmol/L — AB (ref 135–145)
Total Bilirubin: 0.9 mg/dL (ref 0.3–1.2)
Total Protein: 7.7 g/dL (ref 6.5–8.1)

## 2017-01-25 LAB — GLUCOSE, CAPILLARY: Glucose-Capillary: 91 mg/dL (ref 65–99)

## 2017-01-25 LAB — URINE DRUG SCREEN, QUALITATIVE (ARMC ONLY)
AMPHETAMINES, UR SCREEN: NOT DETECTED
Barbiturates, Ur Screen: NOT DETECTED
Benzodiazepine, Ur Scrn: NOT DETECTED
COCAINE METABOLITE, UR ~~LOC~~: POSITIVE — AB
Cannabinoid 50 Ng, Ur ~~LOC~~: NOT DETECTED
MDMA (Ecstasy)Ur Screen: NOT DETECTED
Methadone Scn, Ur: NOT DETECTED
Opiate, Ur Screen: NOT DETECTED
PHENCYCLIDINE (PCP) UR S: NOT DETECTED
Tricyclic, Ur Screen: NOT DETECTED

## 2017-01-25 LAB — CBC
HEMATOCRIT: 41.5 % (ref 35.0–47.0)
HEMOGLOBIN: 14.1 g/dL (ref 12.0–16.0)
MCH: 31.1 pg (ref 26.0–34.0)
MCHC: 34 g/dL (ref 32.0–36.0)
MCV: 91.6 fL (ref 80.0–100.0)
PLATELETS: 280 10*3/uL (ref 150–440)
RBC: 4.53 MIL/uL (ref 3.80–5.20)
RDW: 13.9 % (ref 11.5–14.5)
WBC: 13.5 10*3/uL — ABNORMAL HIGH (ref 3.6–11.0)

## 2017-01-25 LAB — ETHANOL: ALCOHOL ETHYL (B): 85 mg/dL — AB (ref <5)

## 2017-01-25 LAB — POCT PREGNANCY, URINE: PREG TEST UR: NEGATIVE

## 2017-01-25 LAB — SALICYLATE LEVEL

## 2017-01-25 LAB — ACETAMINOPHEN LEVEL: Acetaminophen (Tylenol), Serum: <10 ug/mL — ABNORMAL LOW (ref 10–30)

## 2017-01-25 NOTE — Discharge Instructions (Signed)
You have been seen in the Emergency Department (ED)  today for a psychiatric complaint.  You have been evaluated by psychiatry and we believe you are safe to be discharged from the hospital.   ° °Please return to the Emergency Department (ED)  immediately if you have ANY thoughts of hurting yourself or anyone else, so that we may help you. ° °Please avoid alcohol and drug use. ° °Follow up with your doctor and/or therapist as soon as possible regarding today's ED  visit.  ° °You may call crisis hotline for [place] at [phone]. ° °
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
## 2017-01-25 NOTE — ED Notes (Addendum)
Pt has Zolpidem 10mg  prescription medication, 6 pills in bottle, sent with Altha Harm RN to pharmacy with med adm record.  Other medication in pt's belongings: Gabapentin, Fluoxetine, Topiramate, and Levothyroxine.
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
## 2017-01-25 NOTE — ED Notes (Signed)

## 2017-01-25 NOTE — ED Notes (Signed)
ED Provider at bedside. 

## 2017-01-25 NOTE — ED Notes (Signed)
BEHAVIORAL HEALTH ROUNDING Patient sleeping: No. Patient alert and oriented: yes Behavior appropriate: Yes.  ; If no, describe:  Nutrition and fluids offered: yes Toileting and hygiene offered: Yes  Sitter present: q15 minute observations and security  monitoring Law enforcement present: Yes  ODS  
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
## 2017-01-25 NOTE — ED Notes (Signed)
Pt given milk, graham crackers and peanut butter.

## 2017-01-25 NOTE — ED Provider Notes (Signed)
-----------------------------------------   3:13 PM on 01/25/2017 -----------------------------------------   Blood pressure 101/64, pulse 79, temperature 97.7 F (36.5 C), temperature source Oral, resp. rate 18, height 5\' 7"  (1.702 m), weight 180 lb (81.6 kg), last menstrual period 02/28/2015, SpO2 99 %.  Patient has been evaluated by psychiatry and cleared for discharge. IVC lifted by Dr. Weber Cooks. Patient's labs have been reviewed with no acute findings. Patient will be discharged at this time to home     [place], MD 01/25/17 1513

## 2017-01-25 NOTE — ED Notes (Signed)
BEHAVIORAL HEALTH ROUNDING Patient sleeping: No. Patient alert and oriented: yes Behavior appropriate: Yes.  ; If no, describe:  Nutrition and fluids offered: yes Toileting and hygiene offered: Yes  Sitter present: q15 minute observations and security monitoring Law enforcement present: Yes  ODS   Pt to discharge to home

## 2017-01-25 NOTE — Progress Notes (Signed)
LCSW and ED Psychiatrist consulted this patient is to be D/C. Community resources will be provided to patient for any potential future needs.  [employer] LCSW [phone]

## 2017-01-25 NOTE — ED Notes (Signed)
ED BHU [place] Is the patient under IVC or is there intent for IVC: Yes.   Is the patient medically cleared: Yes.   Is there vacancy in the ED BHU: Yes.   Is the population mix appropriate for patient: Yes.   Is the patient awaiting placement in inpatient or outpatient setting:   Has the patient had a psychiatric consult:  Consult pending   Survey of unit performed for contraband, proper placement and condition of furniture, tampering with fixtures in bathroom, shower, and each patient room: Yes.  ; Findings:  APPEARANCE/BEHAVIOR Calm and cooperative NEURO ASSESSMENT Orientation: oriented x4  Denies pain Hallucinations: No.None noted (Hallucinations) Speech: Normal Gait: normal RESPIRATORY ASSESSMENT Even  Unlabored respirations  CARDIOVASCULAR ASSESSMENT Pulses equal   regular rate  Skin warm and dry   GASTROINTESTINAL ASSESSMENT no GI complaint EXTREMITIES Full ROM  PLAN OF CARE Provide calm/safe environment. Vital signs assessed twice daily. ED BHU Assessment once each 12-hour shift. Collaborate with TTS  daily or as condition indicates. Assure the ED provider has rounded once each shift. Provide and encourage hygiene. Provide redirection as needed. Assess for escalating behavior; address immediately and inform ED provider.  Assess family dynamic and appropriateness for visitation as needed: Yes.  ; If necessary, describe findings:  Educate the patient/family about BHU procedures/visitation: Yes.  ; If necessary, describe findings:
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
## 2017-01-25 NOTE — ED Notes (Signed)
Patient observed lying in bed with eyes closed  Even, unlabored respirations observed  Her head is leaned against the rail of the bed - I repositioned her for comfort and removed her elastic hair tie   NAD pt appears to be sleeping  Snoring can be heard   Cardiac monitor remains in place   I will continue to monitor along with every 15 minute visual observations and ongoing security monitoring

## 2017-01-25 NOTE — ED Notes (Signed)
BEHAVIORAL HEALTH ROUNDING Patient sleeping: Yes.   Patient alert and oriented: eyes closed  Appears to be asleep Behavior appropriate: Yes.  ; If no, describe:  Nutrition and fluids offered: Yes  Toileting and hygiene offered: sleeping Sitter present: q 15 minute observations and security monitoring Law enforcement present: yes  ODS 

## 2017-01-25 NOTE — ED Notes (Signed)
Patient in bathroom

## 2017-02-26 ENCOUNTER — Emergency Department: Payer: Medicare Other

## 2017-02-26 ENCOUNTER — Encounter: Payer: Self-pay | Admitting: Emergency Medicine

## 2017-02-26 ENCOUNTER — Emergency Department
Admission: EM | Admit: 2017-02-26 | Discharge: 2017-02-26 | Disposition: A | Discharge status: Home / Self Care | Payer: Medicare Other | Attending: Emergency Medicine | Admitting: Emergency Medicine

## 2017-02-26 DIAGNOSIS — Y999 Unspecified external cause status: Secondary | ICD-10-CM | POA: Diagnosis not present

## 2017-02-26 DIAGNOSIS — Z79899 Other long term (current) drug therapy: Secondary | ICD-10-CM | POA: Insufficient documentation

## 2017-02-26 DIAGNOSIS — S0990XA Unspecified injury of head, initial encounter: Secondary | ICD-10-CM | POA: Diagnosis present

## 2017-02-26 DIAGNOSIS — S022XXA Fracture of nasal bones, initial encounter for closed fracture: Secondary | ICD-10-CM | POA: Insufficient documentation

## 2017-02-26 DIAGNOSIS — Y9389 Activity, other specified: Secondary | ICD-10-CM | POA: Diagnosis not present

## 2017-02-26 DIAGNOSIS — F17210 Nicotine dependence, cigarettes, uncomplicated: Secondary | ICD-10-CM | POA: Insufficient documentation

## 2017-02-26 DIAGNOSIS — E039 Hypothyroidism, unspecified: Secondary | ICD-10-CM | POA: Insufficient documentation

## 2017-02-26 DIAGNOSIS — Y92149 Unspecified place in prison as the place of occurrence of the external cause: Secondary | ICD-10-CM | POA: Insufficient documentation

## 2017-02-26 LAB — CBC WITH DIFFERENTIAL/PLATELET
BASOS ABS: 0 10*3/uL (ref 0–0.1)
Basophils Relative: 1 %
Eosinophils Absolute: 0.3 10*3/uL (ref 0–0.7)
Eosinophils Relative: 5 %
HEMATOCRIT: 39 % (ref 35.0–47.0)
HEMOGLOBIN: 13.2 g/dL (ref 12.0–16.0)
LYMPHS PCT: 26 %
Lymphs Abs: 1.6 10*3/uL (ref 1.0–3.6)
MCH: 31.8 pg (ref 26.0–34.0)
MCHC: 33.9 g/dL (ref 32.0–36.0)
MCV: 93.7 fL (ref 80.0–100.0)
MONO ABS: 0.4 10*3/uL (ref 0.2–0.9)
MONOS PCT: 7 %
NEUTROS ABS: 3.7 10*3/uL (ref 1.4–6.5)
Neutrophils Relative %: 61 %
Platelets: 253 10*3/uL (ref 150–440)
RBC: 4.16 MIL/uL (ref 3.80–5.20)
RDW: 13.8 % (ref 11.5–14.5)
WBC: 6 10*3/uL (ref 3.6–11.0)

## 2017-02-26 LAB — BASIC METABOLIC PANEL
ANION GAP: 5 (ref 5–15)
BUN: 14 mg/dL (ref 6–20)
CO2: 26 mmol/L (ref 22–32)
Calcium: 8.6 mg/dL — ABNORMAL LOW (ref 8.9–10.3)
Chloride: 107 mmol/L (ref 101–111)
Creatinine, Ser: 0.75 mg/dL (ref 0.44–1.00)
GFR calc Af Amer: >60 mL/min (ref >60)
GFR calc non Af Amer: >60 mL/min (ref >60)
GLUCOSE: 90 mg/dL (ref 65–99)
POTASSIUM: 3.5 mmol/L (ref 3.5–5.1)
Sodium: 138 mmol/L (ref 135–145)

## 2017-02-26 MED ORDER — CEPHALEXIN 500 MG PO CAPS: 500.0000 mg | ORAL_CAPSULE | Freq: Four times a day (QID) | ORAL | 0 refills | Status: AC

## 2017-02-26 MED ORDER — ACETAMINOPHEN 500 MG PO TABS
1000.0000 mg | ORAL_TABLET | Freq: Once | ORAL | Status: AC
  Administered 2017-02-26: 1000 mg via ORAL
  Filled 2017-02-26: qty 2

## 2017-02-26 NOTE — Discharge Instructions (Signed)
Use Tylenol or Advil for the pain. Take the Keflex one pill 4 x a day to help prevent infection in your nose. Follow up with [facility]. Call them tomorrow for an appointment later this week. Return for any worsening of symptoms.
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
## 2017-02-26 NOTE — ED Notes (Signed)
Patient
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
## 2017-02-26 NOTE — ED Notes (Signed)
Patient is using phone to call for ride home.

## 2017-02-26 NOTE — ED Notes (Signed)
Patient left for CT scan. [place] [employer] at bedside.

## 2017-03-24 ENCOUNTER — Other Ambulatory Visit: Payer: Self-pay | Admitting: Psychiatry

## 2017-04-29 ENCOUNTER — Emergency Department
Admission: EM | Admit: 2017-04-29 | Discharge: 2017-04-29 | Disposition: A | Discharge status: Home / Self Care | Payer: Medicare Other | Attending: Emergency Medicine | Admitting: Emergency Medicine

## 2017-04-29 DIAGNOSIS — F10120 Alcohol abuse with intoxication, uncomplicated: Secondary | ICD-10-CM | POA: Diagnosis not present

## 2017-04-29 DIAGNOSIS — Y939 Activity, unspecified: Secondary | ICD-10-CM | POA: Diagnosis not present

## 2017-04-29 DIAGNOSIS — Y929 Unspecified place or not applicable: Secondary | ICD-10-CM | POA: Insufficient documentation

## 2017-04-29 DIAGNOSIS — E039 Hypothyroidism, unspecified: Secondary | ICD-10-CM | POA: Diagnosis not present

## 2017-04-29 DIAGNOSIS — F1010 Alcohol abuse, uncomplicated: Secondary | ICD-10-CM

## 2017-04-29 DIAGNOSIS — F17210 Nicotine dependence, cigarettes, uncomplicated: Secondary | ICD-10-CM | POA: Diagnosis not present

## 2017-04-29 DIAGNOSIS — S0012XA Contusion of left eyelid and periocular area, initial encounter: Secondary | ICD-10-CM | POA: Insufficient documentation

## 2017-04-29 DIAGNOSIS — Y999 Unspecified external cause status: Secondary | ICD-10-CM | POA: Diagnosis not present

## 2017-04-29 DIAGNOSIS — F10920 Alcohol use, unspecified with intoxication, uncomplicated: Secondary | ICD-10-CM

## 2017-04-29 DIAGNOSIS — Z0289 Encounter for other administrative examinations: Secondary | ICD-10-CM | POA: Diagnosis present

## 2017-04-29 LAB — CBC
HCT: 42.7 % (ref 35.0–47.0)
HEMOGLOBIN: 14.4 g/dL (ref 12.0–16.0)
MCH: 31.4 pg (ref 26.0–34.0)
MCHC: 33.7 g/dL (ref 32.0–36.0)
MCV: 93.1 fL (ref 80.0–100.0)
Platelets: 316 10*3/uL (ref 150–440)
RBC: 4.58 MIL/uL (ref 3.80–5.20)
RDW: 13.4 % (ref 11.5–14.5)
WBC: 9.4 10*3/uL (ref 3.6–11.0)

## 2017-04-29 LAB — URINE DRUG SCREEN, QUALITATIVE (ARMC ONLY)
AMPHETAMINES, UR SCREEN: NOT DETECTED
Barbiturates, Ur Screen: NOT DETECTED
Benzodiazepine, Ur Scrn: POSITIVE — AB
COCAINE METABOLITE, UR ~~LOC~~: POSITIVE — AB
Cannabinoid 50 Ng, Ur ~~LOC~~: NOT DETECTED
MDMA (ECSTASY) UR SCREEN: NOT DETECTED
METHADONE SCREEN, URINE: NOT DETECTED
Opiate, Ur Screen: NOT DETECTED
Phencyclidine (PCP) Ur S: NOT DETECTED
TRICYCLIC, UR SCREEN: NOT DETECTED

## 2017-04-29 LAB — COMPREHENSIVE METABOLIC PANEL
ALBUMIN: 4.3 g/dL (ref 3.5–5.0)
ALT: 19 U/L (ref 14–54)
ANION GAP: 10 (ref 5–15)
AST: 27 U/L (ref 15–41)
Alkaline Phosphatase: 64 U/L (ref 38–126)
BILIRUBIN TOTAL: 0.5 mg/dL (ref 0.3–1.2)
BUN: 14 mg/dL (ref 6–20)
CO2: 22 mmol/L (ref 22–32)
Calcium: 9.2 mg/dL (ref 8.9–10.3)
Chloride: 112 mmol/L — ABNORMAL HIGH (ref 101–111)
Creatinine, Ser: 0.84 mg/dL (ref 0.44–1.00)
GFR calc Af Amer: >60 mL/min (ref >60)
GFR calc non Af Amer: >60 mL/min (ref >60)
GLUCOSE: 88 mg/dL (ref 65–99)
POTASSIUM: 3.7 mmol/L (ref 3.5–5.1)
Sodium: 144 mmol/L (ref 135–145)
TOTAL PROTEIN: 8.1 g/dL (ref 6.5–8.1)

## 2017-04-29 LAB — ETHANOL: ALCOHOL ETHYL (B): 264 mg/dL — AB (ref <5)

## 2017-04-29 NOTE — ED Notes (Signed)
Pt given the large pt cup with ice water to drink.Marland Kitchen
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
## 2017-04-29 NOTE — ED Notes (Signed)
Pt states SI, states plan to hurt self with pills. Friends asked if pt had plan while driving here and pt states yes. Pt tearful. States she drank a lot of beers today. States last drink before today was 4 days ago.
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
## 2017-04-29 NOTE — ED Notes (Signed)
Introduced self to pt. Pt was given ice water.

## 2017-04-30 ENCOUNTER — Encounter: Payer: Self-pay | Admitting: Emergency Medicine

## 2017-04-30 ENCOUNTER — Emergency Department
Admission: EM | Admit: 2017-04-30 | Discharge: 2017-05-01 | Disposition: A | Discharge status: Other Acute Inpatient Hospital | Payer: Medicare Other | Attending: Emergency Medicine | Admitting: Emergency Medicine

## 2017-04-30 DIAGNOSIS — E039 Hypothyroidism, unspecified: Secondary | ICD-10-CM | POA: Diagnosis not present

## 2017-04-30 DIAGNOSIS — F17200 Nicotine dependence, unspecified, uncomplicated: Secondary | ICD-10-CM | POA: Diagnosis present

## 2017-04-30 DIAGNOSIS — F1320 Sedative, hypnotic or anxiolytic dependence, uncomplicated: Secondary | ICD-10-CM | POA: Diagnosis present

## 2017-04-30 DIAGNOSIS — F1910 Other psychoactive substance abuse, uncomplicated: Secondary | ICD-10-CM | POA: Insufficient documentation

## 2017-04-30 DIAGNOSIS — F1420 Cocaine dependence, uncomplicated: Secondary | ICD-10-CM | POA: Diagnosis present

## 2017-04-30 DIAGNOSIS — F17210 Nicotine dependence, cigarettes, uncomplicated: Secondary | ICD-10-CM | POA: Insufficient documentation

## 2017-04-30 DIAGNOSIS — F1020 Alcohol dependence, uncomplicated: Secondary | ICD-10-CM | POA: Diagnosis present

## 2017-04-30 DIAGNOSIS — R45851 Suicidal ideations: Secondary | ICD-10-CM | POA: Diagnosis not present

## 2017-04-30 LAB — ETHANOL: Alcohol, Ethyl (B): <5 mg/dL (ref <5)

## 2017-04-30 MED ORDER — BUPROPION HCL ER (XL) 300 MG PO TB24
300.0000 mg | ORAL_TABLET | Freq: Every day | ORAL | Status: DC
  Administered 2017-05-01: 300 mg via ORAL
  Filled 2017-04-30 (×2): qty 1

## 2017-04-30 MED ORDER — LEVOTHYROXINE SODIUM 112 MCG PO TABS
112.0000 ug | ORAL_TABLET | Freq: Every day | ORAL | Status: DC
  Administered 2017-05-01: 112 ug via ORAL
  Filled 2017-04-30: qty 1

## 2017-04-30 MED ORDER — HYDROXYZINE HCL 25 MG PO TABS
25.0000 mg | ORAL_TABLET | Freq: Three times a day (TID) | ORAL | Status: DC | PRN
  Filled 2017-04-30: qty 1

## 2017-04-30 MED ORDER — ZOLPIDEM TARTRATE 5 MG PO TABS
5.0000 mg | ORAL_TABLET | Freq: Every evening | ORAL | Status: DC | PRN
  Administered 2017-04-30: 5 mg via ORAL
  Filled 2017-04-30 (×2): qty 1

## 2017-04-30 MED ORDER — CLONAZEPAM 0.5 MG PO TABS
0.5000 mg | ORAL_TABLET | Freq: Three times a day (TID) | ORAL | Status: DC
  Administered 2017-04-30 – 2017-05-01 (×3): 0.5 mg via ORAL
  Filled 2017-04-30 (×3): qty 1

## 2017-04-30 MED ORDER — TOPIRAMATE 100 MG PO TABS
200.0000 mg | ORAL_TABLET | Freq: Two times a day (BID) | ORAL | Status: DC
  Administered 2017-04-30 – 2017-05-01 (×2): 200 mg via ORAL
  Filled 2017-04-30 (×4): qty 2

## 2017-04-30 NOTE — ED Notes (Signed)
BEHAVIORAL HEALTH ROUNDING Patient sleeping: No. Patient alert and oriented: yes Behavior appropriate: Yes.  ; If no, describe:  Nutrition and fluids offered: yes Toileting and hygiene offered: Yes  Sitter present: q15 minute observations and security  monitoring Law enforcement present: Yes  ODS  
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
## 2017-04-30 NOTE — ED Notes (Signed)
All belongings given to Kelsey Maldonado by patient

## 2017-04-30 NOTE — ED Notes (Signed)
Pt moved into room 23 to perform [place] in private. Pt and [facility] doctor talking at this time.

## 2017-04-30 NOTE — ED Notes (Signed)
PT  VOL °

## 2017-04-30 NOTE — ED Notes (Signed)
SOC completed  - we will await referral

## 2017-04-30 NOTE — ED Notes (Signed)
Soc  Report  Given  To  md

## 2017-04-30 NOTE — ED Notes (Signed)
Pt escorted to [facility] by this tech.

## 2017-04-30 NOTE — ED Notes (Signed)
Red tube drawn and sent to lab. Pt verified that she has had total hysterectomy so no POC was performed. MD and RN notified and both agreed. [facility] is speaking with pt at this time. Pt given lunch tray.

## 2017-04-30 NOTE — ED Notes (Signed)

## 2017-04-30 NOTE — ED Triage Notes (Signed)
Pt c/o SI. Was seen here yesterday for same but when got to back was not having thoughts.  Pt left and is back for same thoughts with plan of taking pills.  Does use alcohol and cocaine. Admits to HI.  When asked who she wants to hurt pt states " a number of people".
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
## 2017-04-30 NOTE — ED Notes (Signed)

## 2017-04-30 NOTE — ED Notes (Signed)
Pt given warm blanket.

## 2017-04-30 NOTE — ED Notes (Signed)
Report provided to [facility] MD  - consult to be performed

## 2017-04-30 NOTE — ED Provider Notes (Signed)
[facility] Emergency Department Provider Note       Time seen: ----------------------------------------- 11:52 AM on 04/30/2017 -----------------------------------------     I have reviewed the triage vital signs and the nursing notes.   HISTORY   Chief Complaint Suicidal    HPI Kelsey Maldonado is a 36 y.o. female who presents to the ED for suicidal ideation. Patient was here yesterday and seen by me for alcohol detox request but she was too intoxicated for RTS according to them. Patient is having thoughts of wanting to overdose on pills. She does use alcohol cocaine but has not used today. She admits to wanting to hurt other people as well.   Past Medical History:  Diagnosis Date  . Bipolar 1 disorder ([place])   . Chronic pain   . Depression   . Endometriosis   . HPV (human papilloma virus) anogenital infection   . Hypothyroidism   . Thyroid disease     Patient Active Problem List   Diagnosis Date Noted  . Tobacco use disorder 08/15/2016  . Cocaine use disorder, moderate, dependence ([place]) 08/14/2016  . Alcohol use disorder, mild, abuse 08/14/2016  . Bipolar 1 disorder, depressed ([place]) 08/14/2016  . Borderline personality disorder 05/15/2016  . Noncompliance 05/15/2016  . Hypothyroid 05/15/2016  . Chronic pelvic pain in female 04/27/2015    Past Surgical History:  Procedure Laterality Date  . LAPAROSCOPIC HYSTERECTOMY Bilateral 04/27/2015   Procedure: HYSTERECTOMY TOTAL LAPAROSCOPIC/BIL.SALPINGECTOMY;  Surgeon: Ruffin Frederick, MD;  Location: [facility] ORS;  Service: Gynecology;  Laterality: Bilateral;  . LYSIS OF ADHESION N/A 04/27/2015   Procedure: LYSIS OF ADHESION;  Surgeon: Ruffin Frederick, MD;  Location: [facility] ORS;  Service: Gynecology;  Laterality: N/A;  . molar pregnancy      Allergies Trazodone and nefazodone; Hydrocodone; Tomato; Toradol [ketorolac tromethamine]; and Tramadol  Social History Social History  Substance Use  Topics  . Smoking status: Current Every Day Smoker    Packs/day: 1.50    Years: 12.00    Types: Cigarettes  . Smokeless tobacco: Never Used  . Alcohol use No    Review of Systems Constitutional: Negative for fever. Eyes: Negative for vision changes ENT:  Negative for congestion, sore throat Cardiovascular: Negative for chest pain. Respiratory: Negative for shortness of breath. Gastrointestinal: Negative for abdominal pain, vomiting and diarrhea. Genitourinary: Negative for dysuria. Musculoskeletal: Positive for diffuse pain Skin: Negative for rash. Neurological: Negative for headaches, focal weakness or numbness. Psychiatric: Positive for suicidal and possibly homicidal ideation, positive for substance abuse  All systems negative/normal/unremarkable except as stated in the HPI  ____________________________________________   PHYSICAL EXAM:  VITAL SIGNS: ED Triage Vitals  Enc Vitals Group     BP 04/30/17 1048 116/77     Pulse Rate 04/30/17 1048 74     Resp 04/30/17 1048 18     Temp 04/30/17 1048 97.9 F (36.6 C)     Temp Source 04/30/17 1048 Oral     SpO2 04/30/17 1048 100 %     Weight 04/30/17 1048 175 lb (79.4 kg)     Height 04/30/17 1048 5\' 7"  (1.702 m)     Head Circumference --      Peak Flow --      Pain Score 04/30/17 1049 5     Pain Loc --      Pain Edu? --      Excl. in [place]? --     Constitutional: Alert and oriented. Well appearing and in no distress. Eyes: Conjunctivae  are normal. Normal extraocular movements. ENT   Head: Normocephalic, old left periorbital ecchymosis   Nose: No congestion/rhinnorhea.   Mouth/Throat: Mucous membranes are moist.   Neck: No stridor. Cardiovascular: Normal rate, regular rhythm. No murmurs, rubs, or gallops. Respiratory: Normal respiratory effort without tachypnea nor retractions. Breath sounds are clear and equal bilaterally. No wheezes/rales/rhonchi. Gastrointestinal: Soft and nontender. Normal bowel  sounds Musculoskeletal: Nontender with normal range of motion in extremities. No lower extremity tenderness nor edema. Neurologic:  Normal speech and language. No gross focal neurologic deficits are appreciated.  Skin:  Skin is warm, dry and intact. No rash noted. Psychiatric: Depressed mood and affect ____________________________________________  ED COURSE:  Pertinent labs & imaging results that were available during my care of the patient were reviewed by me and considered in my medical decision making (see chart for details). Patient presents for SI and possibly HI, we will assess with labs and imaging as indicated.   Procedures ____________________________________________   LABS (pertinent positives/negatives)  Labs Reviewed  ETHANOL  POC URINE PREG, ED     ____________________________________________  FINAL ASSESSMENT AND PLAN  Suicidal ideation, substance abuse  Plan: Patient's labs  were dictated above. Patient had presented for suicidal ideation and substance abuse. She is medically stable for psychiatric evaluation.   Earleen Newport, MD   Note: This note was generated in part or whole with voice recognition software. Voice recognition is usually quite accurate but there are transcription errors that can and very often do occur. I apologize for any typographical errors that were not detected and corrected.     Earleen Newport, MD 04/30/17 1154

## 2017-04-30 NOTE — ED Notes (Signed)
Sandwich and soft drink given.  

## 2017-04-30 NOTE — ED Notes (Signed)
Pt. Alert and oriented, warm and dry, in no distress. Pt. Denies VH. Pt states having SI with no plan, having HI but states she would not act out on it and reports hearing conversations but states she knows they are not real. Patient able to contract for safety with this Probation officer. Pt. Encouraged to let nursing staff know of any concerns or needs.

## 2017-05-01 ENCOUNTER — Inpatient Hospital Stay
Admission: EM | Admit: 2017-05-01 | Discharge: 2017-05-06 | DRG: 885 | Disposition: A | Discharge status: Home / Self Care | Payer: Medicare Other | Source: Intra-hospital | Attending: Psychiatry | Admitting: Psychiatry

## 2017-05-01 ENCOUNTER — Encounter: Payer: Self-pay | Admitting: Psychiatry

## 2017-05-01 DIAGNOSIS — R4587 Impulsiveness: Secondary | ICD-10-CM | POA: Diagnosis present

## 2017-05-01 DIAGNOSIS — F17210 Nicotine dependence, cigarettes, uncomplicated: Secondary | ICD-10-CM | POA: Diagnosis present

## 2017-05-01 DIAGNOSIS — Z818 Family history of other mental and behavioral disorders: Secondary | ICD-10-CM | POA: Diagnosis not present

## 2017-05-01 DIAGNOSIS — F1910 Other psychoactive substance abuse, uncomplicated: Secondary | ICD-10-CM | POA: Diagnosis not present

## 2017-05-01 DIAGNOSIS — Z9114 Patient's other noncompliance with medication regimen: Secondary | ICD-10-CM

## 2017-05-01 DIAGNOSIS — Z91410 Personal history of adult physical and sexual abuse: Secondary | ICD-10-CM | POA: Diagnosis not present

## 2017-05-01 DIAGNOSIS — F4310 Post-traumatic stress disorder, unspecified: Secondary | ICD-10-CM

## 2017-05-01 DIAGNOSIS — Z79899 Other long term (current) drug therapy: Secondary | ICD-10-CM

## 2017-05-01 DIAGNOSIS — Y908 Blood alcohol level of 240 mg/100 ml or more: Secondary | ICD-10-CM | POA: Diagnosis present

## 2017-05-01 DIAGNOSIS — R45850 Homicidal ideations: Secondary | ICD-10-CM | POA: Diagnosis present

## 2017-05-01 DIAGNOSIS — R45851 Suicidal ideations: Secondary | ICD-10-CM | POA: Diagnosis present

## 2017-05-01 DIAGNOSIS — F314 Bipolar disorder, current episode depressed, severe, without psychotic features: Secondary | ICD-10-CM | POA: Diagnosis not present

## 2017-05-01 DIAGNOSIS — F17200 Nicotine dependence, unspecified, uncomplicated: Secondary | ICD-10-CM | POA: Diagnosis present

## 2017-05-01 DIAGNOSIS — Z915 Personal history of self-harm: Secondary | ICD-10-CM | POA: Diagnosis not present

## 2017-05-01 DIAGNOSIS — F1420 Cocaine dependence, uncomplicated: Secondary | ICD-10-CM | POA: Diagnosis present

## 2017-05-01 DIAGNOSIS — F603 Borderline personality disorder: Secondary | ICD-10-CM | POA: Diagnosis present

## 2017-05-01 DIAGNOSIS — G8929 Other chronic pain: Secondary | ICD-10-CM | POA: Diagnosis present

## 2017-05-01 DIAGNOSIS — F1320 Sedative, hypnotic or anxiolytic dependence, uncomplicated: Secondary | ICD-10-CM | POA: Diagnosis present

## 2017-05-01 DIAGNOSIS — F411 Generalized anxiety disorder: Secondary | ICD-10-CM | POA: Diagnosis present

## 2017-05-01 DIAGNOSIS — F319 Bipolar disorder, unspecified: Secondary | ICD-10-CM | POA: Diagnosis present

## 2017-05-01 DIAGNOSIS — F1020 Alcohol dependence, uncomplicated: Secondary | ICD-10-CM | POA: Diagnosis present

## 2017-05-01 DIAGNOSIS — E039 Hypothyroidism, unspecified: Secondary | ICD-10-CM | POA: Diagnosis present

## 2017-05-01 DIAGNOSIS — G4700 Insomnia, unspecified: Secondary | ICD-10-CM | POA: Diagnosis present

## 2017-05-01 DIAGNOSIS — F329 Major depressive disorder, single episode, unspecified: Secondary | ICD-10-CM | POA: Diagnosis present

## 2017-05-01 MED ORDER — DIPHENHYDRAMINE HCL 25 MG PO CAPS: 50.0000 mg | ORAL_CAPSULE | Freq: Every evening | ORAL | Status: DC | PRN

## 2017-05-01 MED ORDER — HYDROXYZINE HCL 25 MG PO TABS
25.0000 mg | ORAL_TABLET | Freq: Three times a day (TID) | ORAL | Status: DC | PRN
  Administered 2017-05-01: 25 mg via ORAL
  Filled 2017-05-01: qty 1

## 2017-05-01 MED ORDER — NICOTINE 21 MG/24HR TD PT24
21.0000 mg | MEDICATED_PATCH | Freq: Every day | TRANSDERMAL | Status: DC
  Administered 2017-05-02 – 2017-05-06 (×5): 21 mg via TRANSDERMAL
  Filled 2017-05-01 (×5): qty 1

## 2017-05-01 MED ORDER — QUETIAPINE FUMARATE 100 MG PO TABS
100.0000 mg | ORAL_TABLET | Freq: Every day | ORAL | Status: DC
  Administered 2017-05-01: 100 mg via ORAL
  Filled 2017-05-01 (×2): qty 1

## 2017-05-01 MED ORDER — LEVOTHYROXINE SODIUM 112 MCG PO TABS
112.0000 ug | ORAL_TABLET | Freq: Every day | ORAL | Status: DC
  Administered 2017-05-02 – 2017-05-06 (×5): 112 ug via ORAL
  Filled 2017-05-01 (×6): qty 1

## 2017-05-01 MED ORDER — ALUM & MAG HYDROXIDE-SIMETH 200-200-20 MG/5ML PO SUSP: 30.0000 mL | ORAL | Status: DC | PRN

## 2017-05-01 MED ORDER — TOPIRAMATE 100 MG PO TABS
200.0000 mg | ORAL_TABLET | Freq: Two times a day (BID) | ORAL | Status: DC
  Administered 2017-05-01 – 2017-05-06 (×10): 200 mg via ORAL
  Filled 2017-05-01 (×10): qty 2

## 2017-05-01 MED ORDER — ACETAMINOPHEN 325 MG PO TABS
650.0000 mg | ORAL_TABLET | Freq: Four times a day (QID) | ORAL | Status: DC | PRN
  Administered 2017-05-02 – 2017-05-06 (×3): 650 mg via ORAL
  Filled 2017-05-01 (×3): qty 2

## 2017-05-01 MED ORDER — BUPROPION HCL ER (XL) 150 MG PO TB24
300.0000 mg | ORAL_TABLET | Freq: Every day | ORAL | Status: DC
  Administered 2017-05-02 – 2017-05-06 (×5): 300 mg via ORAL
  Filled 2017-05-01 (×5): qty 2

## 2017-05-01 MED ORDER — MAGNESIUM HYDROXIDE 400 MG/5ML PO SUSP: 30.0000 mL | Freq: Every day | ORAL | Status: DC | PRN

## 2017-05-01 NOTE — Plan of Care (Signed)
Problem: Education: Goal: Emotional status will improve Encourage patient to express her feelings as needed Outcome: Progressing Patient able to discuss her conditions with staff

## 2017-05-01 NOTE — Plan of Care (Signed)
Problem: Education: Goal: Mental status will improve Patient's thought process will improve Outcome: Progressing Pt is alert and oriented. Thought process organized

## 2017-05-01 NOTE — ED Provider Notes (Signed)
-----------------------------------------   6:51 AM on 05/01/2017 -----------------------------------------   Blood pressure 102/70, pulse 67, temperature 97.5 F (36.4 C), temperature source Oral, resp. rate 16, height 5\' 7"  (1.702 m), weight 79.4 kg (175 lb), last menstrual period 02/28/2015, SpO2 100 %.  The patient had no acute events since last update.  Calm and cooperative at this time.  Disposition is pending Psychiatry/Behavioral Medicine team recommendations.     Paulette Blanch, MD 05/01/17 [phone]

## 2017-05-01 NOTE — ED Notes (Addendum)
Pt discharged to [facility]. Pt has signed voluntary consent form. Report called to [place], Therapist, sports. Pt has no belongings in the hospital.  VS stable.

## 2017-05-01 NOTE — Plan of Care (Signed)
Problem: Education: Goal: Verbalization of understanding the information provided will improve Outcome: Progressing Patient understands treatment process including medication regimen

## 2017-05-01 NOTE — Plan of Care (Signed)
Problem: Health Behavior/Discharge Planning: Goal: Identification of resources available to assist in meeting health care needs will improve Outcome: Not Progressing Patient voice of her Peer Support , instructioned on  involvement from social work for other resources
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
## 2017-05-01 NOTE — Plan of Care (Signed)
Problem: Education: Goal: Knowledge of disease or condition will improve Outcome: Not Progressing Patient refused to discuss

## 2017-05-01 NOTE — BH Assessment (Signed)
Pt chart under review by Dr.Pucilowska for [facility] BMU placement.

## 2017-05-01 NOTE — Plan of Care (Signed)
Problem: Safety: Goal: Ability to identify and utilize support systems that promote safety will improve Outcome: Not Progressing Patient has listing of goals  To work on for safe  Outcome

## 2017-05-01 NOTE — Plan of Care (Signed)
Problem: Coping: Goal: Ability to cope will improve Outcome: Not Progressing Will encourage different ways of coping  Through group and 1:1
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
## 2017-05-01 NOTE — Progress Notes (Signed)
Patient newly admitted. Stayed in room resting. Was able to come to the medication room and discussed about her medications "I don't understand this, I am not getting the right medications". Pt denying SI but expressing hopelessness and helplessness. Was encouraged to discuss her medications with MD in AM. Emotional support provided. Patient went back to bed and currently resting. Safety precautions maintained.

## 2017-05-01 NOTE — BH Assessment (Signed)
Patient is to be admitted to [facility] by Dr. Bary Leriche.  Attending Physician will be Dr. Jerilee Hoh.   Patient has been assigned to room 303A, by [place].    ER staff is aware of the admission Holley Raring, ER Sect.; Amy, Patient's Nurse & Josh, Patient Access).  Clinician unable to reach EDP at this time.

## 2017-05-01 NOTE — Plan of Care (Signed)
Problem: Education: Goal: Mental status will improve Outcome: Not Progressing Voice of concentration difficulties, instructed on balance  With rest and diet

## 2017-05-01 NOTE — Plan of Care (Signed)
Problem: Safety: Goal: Periods of time without injury will increase Outcome: Progressing Remains free from injuries   

## 2017-05-01 NOTE — Plan of Care (Signed)
Problem: Education: Goal: Verbalization of understanding the information provided will improve Outcome: Not Progressing Voice of difficulty  With concentration  Instructed on diet and rest

## 2017-05-01 NOTE — ED Notes (Signed)
Pt received lunch tray 

## 2017-05-01 NOTE — Plan of Care (Signed)
Problem: Coping: Goal: Ability to verbalize feelings will improve Outcome: Not Progressing Encouragement thought  Group therapy to verbalize feeling

## 2017-05-01 NOTE — BH Assessment (Signed)
Intake Paper Work has been signed and placed on patient chart.   EDP Dr.Stafford aware of pt admission.

## 2017-05-01 NOTE — Plan of Care (Signed)
Problem: Education: Goal: Knowledge of  General Education information/materials will improve Outcome: Progressing Patient able to verbalized information received

## 2017-05-01 NOTE — Plan of Care (Signed)
Problem: Health Behavior/Discharge Planning: Goal: Ability to identify changes in lifestyle to reduce recurrence of condition will improve Outcome: Progressing Patient has already goals in place for  Follow up at discharge  With support team

## 2017-05-01 NOTE — Plan of Care (Signed)
Problem: Education: Goal: Knowledge of disease or condition will improve Outcome: Not Progressing Unaware of disease process and how it affect  Body  Goal: Understanding of discharge needs will improve Outcome: Not Progressing Unaware of process of withdrawal and planning for discharge

## 2017-05-01 NOTE — Plan of Care (Signed)
Problem: Education: Goal: Understanding of discharge needs will improve Outcome: Not Progressing Pt refused to discuss
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
## 2017-05-01 NOTE — Plan of Care (Signed)
Problem: Safety: Goal: Periods of time without injury will increase Outcome: Progressing Verbalizing understanding  With call don't fall. Gait normal. Will alert staff for any  concerns

## 2017-05-01 NOTE — Plan of Care (Signed)
Problem: Safety: Goal: Ability to disclose and discuss suicidal ideas will improve Outcome: Not Progressing Continue to voice of suicidal ideation  At this time  No plan

## 2017-05-01 NOTE — ED Notes (Signed)
Patient resting quietly in room. No noted distress or abnormal behaviors noted. Will continue 15 minute checks and observation by security camera for safety. 

## 2017-05-01 NOTE — Plan of Care (Signed)
Problem: Education: Goal: Emotional status will improve Outcome: Progressing Verbalized understanding  Of treatment plan voice of support that is helping her .

## 2017-05-02 ENCOUNTER — Encounter: Payer: Self-pay | Admitting: Psychiatry

## 2017-05-02 DIAGNOSIS — F314 Bipolar disorder, current episode depressed, severe, without psychotic features: Secondary | ICD-10-CM

## 2017-05-02 LAB — CHLAMYDIA/NGC RT PCR (ARMC ONLY)
CHLAMYDIA TR: NOT DETECTED
N gonorrhoeae: NOT DETECTED

## 2017-05-02 MED ORDER — FLUOXETINE HCL 20 MG PO CAPS
40.0000 mg | ORAL_CAPSULE | Freq: Every day | ORAL | Status: DC
  Administered 2017-05-02 – 2017-05-06 (×5): 40 mg via ORAL
  Filled 2017-05-02 (×5): qty 2

## 2017-05-02 MED ORDER — HYDROXYZINE HCL 50 MG PO TABS
50.0000 mg | ORAL_TABLET | Freq: Every day | ORAL | Status: DC
  Administered 2017-05-02: 50 mg via ORAL
  Filled 2017-05-02: qty 1

## 2017-05-02 MED ORDER — HYDROXYZINE HCL 25 MG PO TABS
25.0000 mg | ORAL_TABLET | Freq: Once | ORAL | Status: AC | PRN
  Administered 2017-05-02: 25 mg via ORAL
  Filled 2017-05-02: qty 1

## 2017-05-02 NOTE — BHH Suicide Risk Assessment (Signed)
[facility] Admission Suicide Risk Assessment   Nursing information obtained from:    Demographic factors:    Current Mental Status:    Loss Factors:    Historical Factors:    Risk Reduction Factors:      Principal Problem: Bipolar I disorder, most recent episode depressed, severe without psychotic features ([place]) Diagnosis:   Patient Active Problem List   Diagnosis Date Noted  . Sedative, hypnotic or anxiolytic use disorder, severe, dependence ([place]) [F13.20] 05/01/2017  . Bipolar I disorder, most recent episode depressed, severe without psychotic features ([place]) [F31.4] 05/01/2017  . Tobacco use disorder [F17.200] 08/15/2016  . Cocaine use disorder, moderate, dependence ([place]) [F14.20] 08/14/2016  . Alcohol use disorder, moderate, dependence ([place]) [F10.20] 08/14/2016  . Borderline personality disorder [F60.3] 05/15/2016  . Hypothyroid [E03.9] 05/15/2016  . Chronic pelvic pain in female [R10.2, G89.29] 04/27/2015   Subjective Data:   Continued Clinical Symptoms:  Alcohol Use Disorder Identification Test Final Score (AUDIT): 17 The "Alcohol Use Disorders Identification Test", Guidelines for Use in Primary Care, Second Edition.  [facility]). Score between 0-7:  no or low risk or alcohol related problems. Score between 8-15:  moderate risk of alcohol related problems. Score between 16-19:  high risk of alcohol related problems. Score 20 or above:  warrants further diagnostic evaluation for alcohol dependence and treatment.   CLINICAL FACTORS:   Depression:   Comorbid alcohol abuse/dependence Insomnia Alcohol/Substance Abuse/Dependencies    Psychiatric Specialty Exam: Physical Exam  ROS  Blood pressure (!) 87/51, pulse 77, temperature 97.8 F (36.6 C), temperature source Oral, resp. rate 18, height 5\' 7"  (1.702 m), weight 76.7 kg (169 lb), last menstrual period 02/28/2015, SpO2 100 %.Body mass index is 26.47 kg/m.                                                     Sleep:  Number of Hours: 8      COGNITIVE FEATURES THAT CONTRIBUTE TO RISK:  Polarized thinking    SUICIDE RISK:   Moderate:  Frequent suicidal ideation with limited intensity, and duration, some specificity in terms of plans, no associated intent, good self-control, limited dysphoria/symptomatology, some risk factors present, and identifiable protective factors, including available and accessible social support.  PLAN OF CARE: admit to [facility]  I certify that inpatient services furnished can reasonably be expected to improve the patient's condition.   Hildred Priest, MD 05/02/2017, 9:33 AM

## 2017-05-02 NOTE — Progress Notes (Signed)
Recreation Therapy Notes  Date: 06.28.18 Time: 1:00 pm Location: Craft Room  Group Topic: Leisure Education  Goal Area(s) Addresses:  Patient will identify what type of leisure they prefer (health and wellness/social/etc.) Patient will identify one activity they would like to participate in that addresses their personal leisure interest. Patient will identify one leisure activity they currently participate in that addresses their personal leisure interest.  Behavioral Response: Did not attend  Intervention: Leisure Skills Checklist  Activity: Patients were given a Leisure Skills Checklist and a scoring sheet and were instructed to fill out the checklist and add up their scores.  Education: LRT educated patients on what they need to participate in leisure.  Education Outcome: Patient did not attend group.   Clinical Observations/Feedback: Patient did not attend group.  Leonette Monarch, LRT/CTRS 05/02/2017 1:48 PM

## 2017-05-02 NOTE — BHH Suicide Risk Assessment (Signed)
[place] INPATIENT:  Family/Significant Other Suicide Prevention Education  Suicide Prevention Education:  Contact Attempts: peer support specialist, Tommie Sams ph#: [phone] has been identified by the patient as the family member/significant other with whom the patient will be residing, and identified as the person(s) who will aid the patient in the event of a mental health crisis.  With written consent from the patient, two attempts were made to provide suicide prevention education, prior to and/or following the patient's discharge.  We were unsuccessful in providing suicide prevention education.  A suicide education pamphlet was given to the patient to share with family/significant other.  Date and time of first attempt: 05/02/2017 @ 10:07AM  Emilie Rutter, MSW, LCSW-A 05/02/2017, 10:08 AM

## 2017-05-02 NOTE — Progress Notes (Signed)
Pt calm, cooperative and pleasant.  Interacting appropriately with staff and peers.  At med pass, pt reports she was expecting Klonopin and Prozac to be ordered.  New orders reviewed with patient, pt verbalized understanding.  In evening, pt reports 10/10 anxiety, requesting PRN med.  On call MD called, once PRN hydroxyzine order received.  No behavioral issues.
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
## 2017-05-02 NOTE — Plan of Care (Signed)
Problem: Education: Goal: Knowledge of disease or condition will improve Outcome: Not Progressing Continues to want controlled substances prescribed
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
## 2017-05-02 NOTE — BHH Group Notes (Signed)
[facility] LCSW Group Therapy  05/02/2017 10:19 AM  Type of Therapy:  Group Therapy  Participation Level:  Active  Participation Quality:  Attentive and Sharing  Affect:  Appropriate  Cognitive:  Alert  Insight:  Improving  Engagement in Therapy:  Improving  Modes of Intervention:  Activity, Discussion, Education, Problem-solving, Reality Testing, Socialization and Support  Summary of Progress/Problems: Balance in life: Patients will discuss the concept of balance and how it looks and feels to be unbalanced. Pt will identify areas in their life that is unbalanced and ways to become more balanced. They discussed what aspects in their lives has influenced their self care. Patients also discussed self care in the areas of self regulation/control, hygiene/appearance, sleep/relaxation, healthy leisure, healthy eating habits, exercise, inner peace/spirituality, self improvement, sobriety, and health management. They were challenged to identify changes that are needed in order to improve self care.  Ciria Bernardini G. [place], [place] 05/02/2017, 10:19 AM

## 2017-05-02 NOTE — BHH Group Notes (Signed)
[facility] LCSW Group Therapy Note  Type of Therapy and Topic:  Group Therapy:  Goals Group: SMART Goals  Participation Level:  Patient did not attend group. CSW invited patient to group.   Description of Group:   The purpose of a daily goals group is to assist and guide patients in setting recovery/wellness-related goals.  The objective is to set goals as they relate to the crisis in which they were admitted. Patients will be using SMART goal modalities to set measurable goals.  Characteristics of realistic goals will be discussed and patients will be assisted in setting and processing how one will reach their goal. Facilitator will also assist patients in applying interventions and coping skills learned in psycho-education groups to the SMART goal and process how one will achieve defined goal.  Therapeutic Goals: -Patients will develop and document one goal related to or their crisis in which brought them into treatment. -Patients will be guided by LCSW using SMART goal setting modality in how to set a measurable, attainable, realistic and time sensitive goal.  -Patients will process barriers in reaching goal. -Patients will process interventions in how to overcome and successful in reaching goal.   Summary of Patient Progress:  Patient Goal: None identified at this time, patient did not attend group.   Therapeutic Modalities:   Motivational Interviewing Public relations account executive Therapy Crisis Intervention Model SMART goals setting  Nathaneil Feagans G. [place], [facility] 05/02/2017 10:18 AM
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
## 2017-05-02 NOTE — Plan of Care (Signed)
Problem: Health Behavior/Discharge Planning: Goal: Ability to identify changes in lifestyle to reduce recurrence of condition will improve Outcome: Progressing Voices intention to stop drinking and complete treatment

## 2017-05-03 MED ORDER — ZOLPIDEM TARTRATE 5 MG PO TABS
5.0000 mg | ORAL_TABLET | Freq: Once | ORAL | Status: AC
  Administered 2017-05-03: 5 mg via ORAL
  Filled 2017-05-03: qty 1

## 2017-05-03 MED ORDER — HYDROXYZINE HCL 50 MG PO TABS
50.0000 mg | ORAL_TABLET | Freq: Four times a day (QID) | ORAL | Status: DC | PRN
  Administered 2017-05-03 – 2017-05-06 (×7): 50 mg via ORAL
  Filled 2017-05-03 (×8): qty 1

## 2017-05-03 NOTE — Plan of Care (Signed)
Problem: Education: Goal: Understanding of discharge needs will improve Outcome: Not Progressing Not willing to discuss about it

## 2017-05-03 NOTE — Plan of Care (Signed)
Problem: Education: Goal: Emotional status will improve Encourage patient to express her feelings as needed  Outcome: Progressing Pt was able to express her state of anxiety and to request medication

## 2017-05-03 NOTE — Plan of Care (Signed)
Problem: Education: Goal: Knowledge of disease or condition will improve Outcome: Not Progressing Pt fixated not willing to understand her health condition

## 2017-05-03 NOTE — Plan of Care (Signed)
Problem: Education: Goal: Mental status will improve Patient's thought process will improve  Outcome: Progressing Thought process organized
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
## 2017-05-03 NOTE — BHH Group Notes (Signed)
[facility] Group Notes:  (Nursing/MHT/Case Management/Adjunct)  Date:  05/03/2017  Time:  4:19 AM  Type of Therapy:  Psychoeducational Skills  Participation Level:  Active  Participation Quality:  Appropriate, Attentive and Sharing  Affect:  Appropriate  Cognitive:  Appropriate  Insight:  Appropriate and Good  Engagement in Group:  Engaged  Modes of Intervention:  Discussion, Socialization and Support  Summary of Progress/Problems:  Kelsey Maldonado 05/03/2017, 4:19 AM

## 2017-05-03 NOTE — Plan of Care (Signed)
Problem: Physical Regulation: Goal: Will remain free from infection Outcome: Progressing Infection precautions maintained

## 2017-05-03 NOTE — B[facility] Suicide Risk Assessment (Signed)
[facility] INPATIENT:  Family/Significant Other Suicide Prevention Education  Suicide Prevention Education:  Education Completed; peer support specialist, Tommie Sams ph#: [phone] has been identified by the patient as the family member/significant other with whom the patient will be residing, and identified as the person(s) who will aid the patient in the event of a mental health crisis (suicidal ideations/suicide attempt).  With written consent from the patient, the family member/significant other has been provided the following suicide prevention education, prior to the and/or following the discharge of the patient.  The suicide prevention education provided includes the following:  Suicide risk factors  Suicide prevention and interventions  National Suicide Hotline telephone number  [facility] assessment telephone number  [facility] Emergency Assistance [place] and/or Residential Mobile Crisis Unit telephone number  Request made of family/significant other to:  Remove weapons (e.g., guns, rifles, knives), all items previously/currently identified as safety concern.    Remove drugs/medications (over-the-counter, prescriptions, illicit drugs), all items previously/currently identified as a safety concern.  The family member/significant other verbalizes understanding of the suicide prevention education information provided.  The family member/significant other agrees to remove the items of safety concern listed above.  Emilie Rutter, MSW, LCSW-A 05/03/2017, 9:03 AM

## 2017-05-03 NOTE — Plan of Care (Signed)
Problem: Safety: Goal: Ability to disclose and discuss suicidal ideas will improve Outcome: Progressing Denies SI, but reports "Im just down."

## 2017-05-03 NOTE — Progress Notes (Signed)
Recreation Therapy Notes  Date: 06.29.18 Time: 1:00 pm Location: Craft Room  Group Topic: Coping Skills  Goal Area(s) Addresses:  Patient will verbalize importance of recognizing emotions. Patient will identify at least one emotion. Patient will identify healthy coping skills for emotions.  Behavioral Response: Attentive, Interactive  Intervention: Emotion Wheel  Activity: Patients were given an Licensed conveyancer and were instructed to as a group come up with 8 emotions towards their recovery and write the emotions on the outside of the wheel. On the inside, patients were instructed to write healthy coping skills for each emotion.  Education: LRT educated patients on healthy coping skills.  Education Outcome: Acknowledges education/In group clarification offered  Clinical Observations/Feedback: Patient identified emotions towards her recovery and wrote coping skills to help cope with her emotions. Patient contributed to group discussion by stating it was easy and difficult to think of emotions and coping skills, and how she can recognize her emotions.  Leonette Monarch, LRT/CTRS 05/03/2017 1:57 PM

## 2017-05-03 NOTE — Plan of Care (Signed)
Problem: Coping: Goal: Ability to cope will improve Outcome: Progressing Pt continues to voice feeling anxious and requests "more medications." Encouraged use of coping skills, PRN medication given.

## 2017-05-03 NOTE — Tx Team (Addendum)
Interdisciplinary Treatment and Diagnostic Plan Update  05/03/2017 Time of Session: 11:00 AM  Kelsey Maldonado MRN: [id number]  Principal Diagnosis: Bipolar I disorder, most recent episode depressed, severe without psychotic features ([place])  Secondary Diagnoses: Principal Problem:   Bipolar I disorder, most recent episode depressed, severe without psychotic features ([place]) Active Problems:   Borderline personality disorder   Hypothyroid   Cocaine use disorder, moderate, dependence ([facility])   Alcohol use disorder, moderate, dependence ([facility])   Tobacco use disorder   Sedative, hypnotic or anxiolytic use disorder, severe, dependence ([place])   Current Medications:  Current Facility-Administered Medications  Medication Dose Route Frequency Provider Last Rate Last Dose  . acetaminophen (TYLENOL) tablet 650 mg  650 mg Oral Q6H PRN Pucilowska, Jolanta B, MD   650 mg at 05/02/17 1042  . alum & mag hydroxide-simeth (MAALOX/MYLANTA) 200-200-20 MG/5ML suspension 30 mL  30 mL Oral Q4H PRN Pucilowska, Jolanta B, MD      . buPROPion (WELLBUTRIN XL) 24 hr tablet 300 mg  300 mg Oral Daily Pucilowska, Jolanta B, MD   300 mg at 05/03/17 0828  . FLUoxetine (PROZAC) capsule 40 mg  40 mg Oral Daily Hildred Priest, MD   40 mg at 05/03/17 4098  . hydrOXYzine (ATARAX/VISTARIL) tablet 50 mg  50 mg Oral Q6H PRN Hildred Priest, MD   50 mg at 05/03/17 1234  . levothyroxine (SYNTHROID, LEVOTHROID) tablet 112 mcg  112 mcg Oral Q0600 Pucilowska, Jolanta B, MD   112 mcg at 05/03/17 0704  . magnesium hydroxide (MILK OF MAGNESIA) suspension 30 mL  30 mL Oral Daily PRN Pucilowska, Jolanta B, MD      . nicotine (NICODERM CQ - dosed in mg/24 hours) patch 21 mg  21 mg Transdermal Q0600 Pucilowska, Jolanta B, MD   21 mg at 05/03/17 0828  . topiramate (TOPAMAX) tablet 200 mg  200 mg Oral BID Pucilowska, Jolanta B, MD   200 mg at 05/03/17 1191   PTA Medications: Prescriptions Prior to Admission  Medication  Sig Dispense Refill Last Dose  . buPROPion (WELLBUTRIN XL) 300 MG 24 hr tablet Take 1 tablet (300 mg total) by mouth daily. 30 tablet 1 Past Week at Unknown time  . levothyroxine (SYNTHROID, LEVOTHROID) 112 MCG tablet Take 1 tablet (112 mcg total) by mouth daily before breakfast. 30 tablet 1 Past Week at Unknown time  . topiramate (TOPAMAX) 200 MG tablet Take 1 tablet (200 mg total) by mouth 2 (two) times daily. 60 tablet 1 Past Week at Unknown time  . zolpidem (AMBIEN) 10 MG tablet Take 10 mg by mouth at bedtime as needed for sleep.  2 Past Week at Unknown time  . hydrOXYzine (ATARAX/VISTARIL) 25 MG tablet Take 1 tablet (25 mg total) by mouth every 8 (eight) hours as needed for anxiety. (Patient not taking: Reported on 04/30/2017) 90 tablet 1 Completed Course at Unknown time  . ibuprofen (ADVIL,MOTRIN) 800 MG tablet Take 1 tablet (800 mg total) by mouth every 8 (eight) hours as needed. (Patient not taking: Reported on 04/30/2017) 30 tablet 0 Completed Course at Unknown time  . zolpidem (AMBIEN) 5 MG tablet Take 1 tablet (5 mg total) by mouth at bedtime as needed for sleep. (Patient not taking: Reported on 04/30/2017) 30 tablet 0 Completed Course at Unknown time    Patient Stressors: Substance abuse Traumatic event  Patient Strengths: Ability for insight Active sense of humor Capable of independent living Communication skills Supportive family/friends  Treatment Modalities: Medication Management, Group therapy, Case management,  1 to 1 session with clinician, Psychoeducation, Recreational therapy.   Physician Treatment Plan for Primary Diagnosis: Bipolar I disorder, most recent episode depressed, severe without psychotic features ([place]) Long Term Goal(s): Improvement in symptoms so as ready for discharge Improvement in symptoms so as ready for discharge   Short Term Goals: Ability to identify changes in lifestyle to reduce recurrence of condition will improve Ability to demonstrate  self-control will improve Ability to identify triggers associated with substance abuse/mental health issues will improve Ability to identify and develop effective coping behaviors will improve  Medication Management: Evaluate patient's response, side effects, and tolerance of medication regimen.  Therapeutic Interventions: 1 to 1 sessions, Unit Group sessions and Medication administration.  Evaluation of Outcomes: Progressing  Physician Treatment Plan for Secondary Diagnosis: Principal Problem:   Bipolar I disorder, most recent episode depressed, severe without psychotic features ([place]) Active Problems:   Borderline personality disorder   Hypothyroid   Cocaine use disorder, moderate, dependence ([facility])   Alcohol use disorder, moderate, dependence ([facility])   Tobacco use disorder   Sedative, hypnotic or anxiolytic use disorder, severe, dependence ([place])  Long Term Goal(s): Improvement in symptoms so as ready for discharge Improvement in symptoms so as ready for discharge   Short Term Goals: Ability to identify changes in lifestyle to reduce recurrence of condition will improve Ability to demonstrate self-control will improve Ability to identify triggers associated with substance abuse/mental health issues will improve Ability to identify and develop effective coping behaviors will improve     Medication Management: Evaluate patient's response, side effects, and tolerance of medication regimen.  Therapeutic Interventions: 1 to 1 sessions, Unit Group sessions and Medication administration.  Evaluation of Outcomes: Progressing   RN Treatment Plan for Primary Diagnosis: Bipolar I disorder, most recent episode depressed, severe without psychotic features ([place]) Long Term Goal(s): Knowledge of disease and therapeutic regimen to maintain health will improve  Short Term Goals: Ability to demonstrate self-control, Ability to identify and develop effective coping behaviors will improve and  Compliance with prescribed medications will improve  Medication Management: RN will administer medications as ordered by provider, will assess and evaluate patient's response and provide education to patient for prescribed medication. RN will report any adverse and/or side effects to prescribing provider.  Therapeutic Interventions: 1 on 1 counseling sessions, Psychoeducation, Medication administration, Evaluate responses to treatment, Monitor vital signs and CBGs as ordered, Perform/monitor CIWA, COWS, AIMS and Fall Risk screenings as ordered, Perform wound care treatments as ordered.  Evaluation of Outcomes: Progressing   LCSW Treatment Plan for Primary Diagnosis: Bipolar I disorder, most recent episode depressed, severe without psychotic features ([place]) Long Term Goal(s): Safe transition to appropriate next level of care at discharge, Engage patient in therapeutic group addressing interpersonal concerns.  Short Term Goals: Engage patient in aftercare planning with referrals and resources, Increase social support and Facilitate acceptance of mental health diagnosis and concerns  Therapeutic Interventions: Assess for all discharge needs, 1 to 1 time with Social worker, Explore available resources and support systems, Assess for adequacy in community support network, Educate family and significant other(s) on suicide prevention, Complete Psychosocial Assessment, Interpersonal group therapy.  Evaluation of Outcomes: Progressing    Recreational Therapy Treatment Plan for Primary Diagnosis: Bipolar I disorder, most recent episode depressed, severe without psychotic features ([place]) Long Term Goal(s): Patient will participate in recreation therapy treatment in at least 2 group sessions without prompting from LRT  Short Term Goals: Increase self-esteem, Increase healthy coping skills  Treatment Modalities: Group Therapy  and Individual Treatment Sessions  Therapeutic Interventions:  Psychoeducation  Evaluation of Outcomes: Progressing   Progress in Treatment: Attending groups: Yes. Participating in groups: Yes. Taking medication as prescribed: Yes. Toleration medication: Yes. Family/Significant other contact made: Yes, individual(s) contacted:  CSW spoke with patient's peer support specialist Patient understands diagnosis: Yes. Discussing patient identified problems/goals with staff: Yes. Medical problems stabilized or resolved: Yes. Denies suicidal/homicidal ideation: Yes. Issues/concerns per patient self-inventory: No.  New problem(s) identified: No, Describe:  None.  New Short Term/Long Term Goal(s): Patient stated that her goal is to get medications for sleep.  Discharge Plan or Barriers: CSW assessing appropriate plan.  Reason for Continuation of Hospitalization: Depression Medication stabilization Suicidal ideation  Estimated Length of Stay: 2 days   Attendees: Patient: Kelsey Maldonado 05/03/2017 1:04 PM  Physician: Dr. Merlyn Albert, MD  05/03/2017 1:04 PM  Nursing: Elige Radon, BSN, RN 05/03/2017 1:04 PM  RN Care Manager: 05/03/2017 1:04 PM  Social Worker: Glorious Peach, MSW, LCSW-A 05/03/2017 1:04 PM  Recreational Therapist: Drue Flirt, LRT/CTRS  05/03/2017 1:04 PM  Other:  05/03/2017 1:04 PM  Other:  05/03/2017 1:04 PM  Other: 05/03/2017 1:04 PM    Scribe for Treatment Team: Emilie Rutter, [place] 05/03/2017 1:04 PM

## 2017-05-03 NOTE — Plan of Care (Signed)
Problem: [facility] Participation in Recreation Therapeutic Interventions Goal: STG-Patient will demonstrate improved self esteem by identif STG: Self-Esteem - Within 4 treatment sessions, patient will verbalize at least 5 positive affirmation statements in each of 2 treatment sessions to increase self-esteem.  Outcome: Progressing Treatment Session 1; Completed 1 out of 2: At approximately 2:10 pm, LRT met with patient in patient room. Patient verbalized 5 positive affirmation statements. Patient reported it felt "wonderful". LRT encouraged patient to continue saying positive affirmation statements.  Leonette Monarch, LRT/CTRS 06.29.18 3:15 pm Goal: STG-Patient will identify at least five coping skills for ** STG: Coping Skills - Within 4 treatment sessions, patient will verbalize at least 5 coping skills for substance abuse in each of 2 treatment sessions to decrease substance abuse.  Outcome: Progressing Treatment Session 1; Completed 1 out of 2: At approximately 2:10 pm, LRT met with patient in patient room. Patient verbalized 5 coping skills for substance abuse. LRT educated patient on leisure and why it is important to implement it into her schedule. LRT educated and provided patient with blank schedules to help her plan her day and try to avoid using substances. LRT educated patient on healthy support systems.  Leonette Monarch, LRT/CTRS 06.29.18 3:17 pm

## 2017-05-03 NOTE — Progress Notes (Signed)
   05/03/17 1040  Clinical Encounter Type  Visited With Patient;Health care provider  Visit Type Initial;Behavioral Health;Other (Comment) (Treatment Team)  Referral From Care management;Other (Comment)   [place] present for treatment team meeting. Pt stated several times her need for sleep.
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
## 2017-05-03 NOTE — Plan of Care (Signed)
Problem: Education: Goal: Verbalization of understanding the information provided will improve Outcome: Progressing Pt able to comprehend during education

## 2017-05-03 NOTE — B[facility] Group Notes (Signed)
[facility] LCSW Group Therapy  05/03/2017 10:16 AM  Type of Therapy:  Group Therapy  Participation Level:  Active  Participation Quality:  Attentive and Sharing  Affect:  Anxious and Appropriate  Cognitive:  Alert  Insight:  Improving  Engagement in Therapy:  Improving  Modes of Intervention:  Activity, Discussion, Education, Problem-solving, Reality Testing, Socialization and Support  Summary of Progress/Problems: Feelings around Relapse. Group members discussed the meaning of relapse and shared personal stories of relapse, how it affected them and others, and how they perceived themselves during this time. Group members were encouraged to identify triggers, warning signs and coping skills used when facing the possibility of relapse. Social supports were discussed and explored in detail. Patients also discussed facing disappointment and how that can trigger someone to relapse.   Terecia Plaut G. [place], [place] 05/03/2017, 10:16 AM

## 2017-05-03 NOTE — Plan of Care (Signed)
Problem: Safety: Goal: Periods of time without injury will increase Outcome: Progressing No self harm behavior

## 2017-05-03 NOTE — Social Work (Signed)
CSW faxed referral to [facility] 6/28 who informed CSW of possible bed 6/29. CSW called today to confirm bed availability - Shayla (admissions coordinator) stated no residential bed available at this time. CSW will call to check on bed tomorrow 6/30.  Glorious Peach, MSW, LCSW-A 05/03/2017, 8:42AM

## 2017-05-03 NOTE — Plan of Care (Signed)
Problem: Coping: Goal: Ability to verbalize feelings will improve Outcome: Progressing Pt is able to voice feelings, denies SI/, but reports "just down."

## 2017-05-03 NOTE — Progress Notes (Signed)
Patient stayed in the milieu until bed time. mostly in the dayroom. Interacting with peers appropriately. Complained of anxiety, became restless and medications given. Not willing to discuss discharge plans. Denying SI but expressing hopelessness. Was encouraged to express her thoughts and feelings as needed. Therapeutic milieu promoted. Pt currently in bed sleeping. Safety precautions maintained.

## 2017-05-03 NOTE — Plan of Care (Signed)
Problem: Safety: Goal: Ability to identify and utilize support systems that promote safety will improve Outcome: Progressing Pt does utilize staff to support safety.

## 2017-05-03 NOTE — Progress Notes (Signed)
Awake, alert, oriented and up on unit today. Appropriately interacts with staff/peers, observed pt socializing in dayroom. Attends group. Pt continues to complain of anxiety, requesting specifically vistaril this morning. PRN vistaril ordered and administered, and during administration, pt states, "I really want something more than that." Encouraged use of coping skills. Denies SI, but reports "I'm just down." Denies HI/AVH. Pt appears calm, is cooperative and pleasant. Pt is medication compliant. Support and encouragement provided. Medications administered as ordered with education. Safety maintained with every 15 minute checks. Will continue to monitor.

## 2017-05-04 MED ORDER — QUETIAPINE FUMARATE 100 MG PO TABS
100.0000 mg | ORAL_TABLET | Freq: Every day | ORAL | Status: DC
  Administered 2017-05-04: 100 mg via ORAL
  Filled 2017-05-04: qty 1

## 2017-05-04 NOTE — Plan of Care (Signed)
Problem: Coping: Goal: Ability to cope will improve Outcome: Progressing Working on coping skills   

## 2017-05-04 NOTE — B[facility] Group Notes (Signed)
[facility] LCSW Group Therapy  05/04/2017 3:00 PM  Type of Therapy:  Group Therapy  Participation Level:  Patient did not attend group. CSW invited patient to group.   Summary of Progress/Problems: Communications: Patients identify how individuals communicate with one another appropriately and inappropriately. Patients will be guided to discuss their thoughts, feelings, and behaviors related to barriers when communicating. The group will process together ways to execute positive and appropriate communications.   Shaya Reddick G. [place], [place] 05/04/2017, 3:00 PM
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
## 2017-05-04 NOTE — Progress Notes (Signed)
D: Pt visible and social with select peers.  Appeared nervous by her peers yelling out.  Asked if she wanted to change rooms and she did not.  Pt focused on her anxiety and that she will not have Ambien to sleep tonight.  Pt did try to ask if she could get something else to sleep but had not even tried to sleep.  Pt was informed that she had spoken with the doctor and Ambien is not recommended and will not be ordered. Denies s/i, h/i or hallucinations.  A: Support provided to pt and recommendations to relax. Encouraged to let staff know if she is not sleeping.Monitored on 15 minute safety checks and maintained safety. R: Pt discouraged about med's but improved mood. Monitor safety and cont tx plan.

## 2017-05-04 NOTE — [facility] Group Notes (Signed)
[facility] Group Notes:  (Nursing/MHT/Case Management/Adjunct)  Date:  05/04/2017  Time:  5:15 AM  Type of Therapy:  Psychoeducational Skills  Participation Level:  Active  Participation Quality:  Appropriate, Attentive and Sharing  Affect:  Appropriate  Cognitive:  Appropriate  Insight:  Appropriate  Engagement in Group:  Engaged  Modes of Intervention:  Discussion, Socialization and Support  Summary of Progress/Problems:  Kelsey Maldonado 05/04/2017, 5:15 AM

## 2017-05-04 NOTE — Progress Notes (Signed)
Patient stayed in the milieu most of the evening, she requested PRN hydroxyzine with no relief. She interacted well with peers appropriately. She later complained of anxiety and not being able to sleep, MD called and order given for Ambien x1 for sleep with effect.  She denies suicidal ideation but remains depressed. She appears to be resting in bed at this time.

## 2017-05-04 NOTE — Progress Notes (Signed)
D: Patient stated slept good last night .Stated appetite is good and energy level  Is normal. Stated concentration is good . Stated on Depression scale 5 , hopeless 4 and anxiety  10.( low 0-10 high) Denies suicidal  homicidal ideations  .  No auditory hallucinations  No pain concerns . Appropriate ADL'S. Interacting with peers and staff. Working on Art therapist of feeling like a yo yo  A: Encourage patient participation with unit programming . Instruction  Given on  Medication , verbalize understanding. R: Voice no other concerns. Staff continue to monitor

## 2017-05-04 NOTE — Plan of Care (Signed)
Problem: Coping: Goal: Ability to verbalize feelings will improve Outcome: Progressing Attending unit programing

## 2017-05-05 MED ORDER — QUETIAPINE FUMARATE 200 MG PO TABS
200.0000 mg | ORAL_TABLET | Freq: Every day | ORAL | Status: DC
  Administered 2017-05-05: 200 mg via ORAL
  Filled 2017-05-05: qty 1

## 2017-05-05 NOTE — Progress Notes (Signed)
Pt spends most of the shift in bed resting.  States she felt tired and thought she would catch up with her sleep.  Pt admits that the Seroquel helped her last night and is hopeful the increase in dose will help her sleep longer.  Pt denies current s/i, h/i or hallucinations.  Pt monitored on 15 minute safety checks and maintained safety.  Pt is medication compliant, cooperative, and displays an improved mood.

## 2017-05-05 NOTE — Progress Notes (Signed)
Patient was visible on the unit sitting in the dayroom watching TV and interacting with select peers and staff. Patient reported she is doing much better than yesterday, but still experiencing some anxiety. Patient received PRN vistaril in the AM. Patient attend unit group and went outside for fresh air. Patient complained of an headache and received tylenol 650 mg by mouth. Patient is alert and oriented x 4, breathing unlabored, and extremities x 4 within normal limits. Patient is calm and cooperative. Patient did not display any disruptive behavior. Patient is compliant with medications, meals, and snack. Patient continues to be monitored on 15 minute safety checks. Will continue to monitor patient and notify MD of any changes.

## 2017-05-05 NOTE — Plan of Care (Signed)
Problem: Education: Goal: Knowledge of [facility] Education information/materials will improve Outcome: Progressing Pt has improved knowledge Goal: Emotional status will improve Encourage patient to express her feelings as needed  Outcome: Progressing Improved mood Goal: Mental status will improve Patient's thought process will improve  Outcome: Progressing No stated hallucinations Goal: Verbalization of understanding the information provided will improve Outcome: Progressing Pt displays understanding of education  Problem: Safety: Goal: Periods of time without injury will increase Outcome: Progressing No injury
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
## 2017-05-05 NOTE — Plan of Care (Signed)
Problem: Education: Goal: Knowledge of [place] [facility] Education information/materials will improve Outcome: Progressing Improved insight into information Goal: Emotional status will improve Encourage patient to express her feelings as needed  Outcome: Progressing Slightly improved mood Goal: Mental status will improve Patient's thought process will improve  Outcome: Progressing Improved mood Goal: Verbalization of understanding the information provided will improve Outcome: Progressing Able to verbalize understanding  Problem: Education: Goal: Knowledge of disease or condition will improve Outcome: Progressing Improved insight into illness Goal: Understanding of discharge needs will improve Outcome: Progressing Understanding of discharge need  Problem: Health Behavior/Discharge Planning: Goal: Ability to identify changes in lifestyle to reduce recurrence of condition will improve Outcome: Progressing Discussed need for changes in lifestyle Goal: Identification of resources available to assist in meeting health care needs will improve Outcome: Not Progressing Has not identified resources  Problem: Coping: Goal: Ability to cope will improve Outcome: Progressing Slightly improved coping skills Goal: Ability to verbalize feelings will improve Outcome: Progressing Is able to discuss feelings  Problem: Safety: Goal: Ability to disclose and discuss suicidal ideas will improve Outcome: Completed/Met Date Met: 05/05/17 Not suicidal Goal: Ability to identify and utilize support systems that promote safety will improve Outcome: Progressing Not identifying supports yet  Problem: [facility] Participation in Recreation Therapeutic Interventions Goal: STG-Patient will demonstrate improved self esteem by identif STG: Self-Esteem - Within 4 treatment sessions, patient will verbalize at least 5 positive affirmation statements in each of 2 treatment sessions to increase self-esteem.   Outcome: Not Progressing Has not identified 5 positive affirmations

## 2017-05-05 NOTE — [facility] Group Notes (Signed)
[facility] LCSW Group Therapy  05/05/2017 2:12 PM  Type of Therapy:  Group Therapy  Participation Level:  Active  Participation Quality:  Appropriate  Affect:  Appropriate  Cognitive:  Alert  Insight:  Developing/Improving  Engagement in Therapy:  Distracting  Modes of Intervention:  Activity, Discussion, Education, Problem-solving, Reality Testing, Socialization and Support  Summary of Progress/Problems: Stress management: Patients defined and discussed the topic of stress and the related symptoms and triggers for stress. Patients identified healthy coping skills they would like to try during hospitalization and after discharge to manage stress in a healthy way. CSW offered insight to varying stress management techniques.   Keison Glendinning G. [place], [place] 05/05/2017, 2:19 PM

## 2017-05-05 NOTE — Progress Notes (Signed)
Pt appeared to sleep about 5.5 hours while monitored on 15 minute safety checks.  She did not think she would sleep and was educated to let staff know if she is not sleeping and has not done this.

## 2017-05-05 NOTE — Plan of Care (Signed)
Problem: Safety: Goal: Periods of time without injury will increase Outcome: Progressing Patient remains safe and without injury during hospitalization and on Q 15 monitoring. Will continue to monitor.

## 2017-05-05 NOTE — [facility] Group Notes (Signed)
[facility] Group Notes:  (Nursing/MHT/Case Management/Adjunct)  Date:  05/05/2017  Time:  12:42 AM  Type of Therapy:  Group Therapy  Participation Level:  Active  Participation Quality:  Appropriate  Affect:  Appropriate  Cognitive:  Appropriate  Insight:  Good  Engagement in Group:  Engaged  Modes of Intervention:  Support  Summary of Progress/Problems:  Kelsey Maldonado 05/05/2017, 12:42 AM

## 2017-05-06 DIAGNOSIS — F4310 Post-traumatic stress disorder, unspecified: Secondary | ICD-10-CM

## 2017-05-06 DIAGNOSIS — F319 Bipolar disorder, unspecified: Secondary | ICD-10-CM

## 2017-05-06 MED ORDER — BUPROPION HCL ER (XL) 300 MG PO TB24: 300.0000 mg | ORAL_TABLET | Freq: Every day | ORAL | 0 refills | Status: AC

## 2017-05-06 MED ORDER — TOPIRAMATE 200 MG PO TABS: 200.0000 mg | ORAL_TABLET | Freq: Two times a day (BID) | ORAL | 0 refills | Status: AC

## 2017-05-06 MED ORDER — FLUOXETINE HCL 40 MG PO CAPS: 40.0000 mg | ORAL_CAPSULE | Freq: Every day | ORAL | 0 refills | Status: AC

## 2017-05-06 MED ORDER — LEVOTHYROXINE SODIUM 112 MCG PO TABS: 112.0000 ug | ORAL_TABLET | Freq: Every day | ORAL | 0 refills | Status: AC

## 2017-05-06 MED ORDER — HYDROXYZINE HCL 25 MG PO TABS: 25.0000 mg | ORAL_TABLET | Freq: Three times a day (TID) | ORAL | 1 refills | Status: AC | PRN

## 2017-05-06 NOTE — Progress Notes (Signed)
  [facility] Adult Case Management Discharge Plan :  Will you be returning to the same living situation after discharge:  Yes,  return to boarding house At discharge, do you have transportation home?: Yes,  peer support specialist Do you have the ability to pay for your medications: Yes, Medicaid/Medicare.  Release of information consent forms completed and in the chart;  Patient's signature needed at discharge.  Patient to Follow up at: Follow-up [facility]. Go on 05/09/2017.   Why:  Please arrive to [place] on July 5th at 11AM to meet with your therapist, Marjie Skiff. She will also refer you to [facility]. Please bring discharge paperwork to appt.  Contact information: [place] [address] [phone]           Next level of care provider has access to [place] and Suicide Prevention discussed: Yes,  SPE completed with peer support specialist  Have you used any form of tobacco in the last 30 days? (Cigarettes, Smokeless Tobacco, Cigars, and/or Pipes): Yes  Has patient been referred to the Quitline?: Patient refused referral  Patient has been referred for addiction treatment: Yes  Emilie Rutter, MSW, LCSW-A 05/06/2017, 10:07 AM

## 2017-05-06 NOTE — Progress Notes (Signed)
Pt slept all night while monitored on 15 minute safety checks.  Pt slept about 7.5 hours.

## 2017-05-06 NOTE — [facility] Group Notes (Signed)
[facility] Group Notes:  (Nursing/MHT/Case Management/Adjunct)  Date:  05/06/2017  Time:  12:01 AM  Type of Therapy:  Evening Wrap-up Group  Participation Level:  Did Not Attend  Participation Quality:  N/A  Affect:  N/A  Cognitive:  N/A  Insight:  None  Engagement in Group:  Did Not Attend  Modes of Intervention:  Activity  Summary of Progress/Problems:  [name] 05/06/2017, 12:01 AM
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
## 2017-05-06 NOTE — Progress Notes (Signed)
Recreation Therapy Notes  Date: 07.02.18 Time: 1:00 pm Location: Craft Room  Group Topic: Wellness  Goal Area(s) Addresses:  Patient will identify at least one item per dimension of health. Patient will examine areas they are deficient in.  Behavioral Response: Attentive, Interactive  Intervention: 6 Dimensions of Health  Activity: Patients were given a definition sheet defining each dimension of health and a worksheet with each dimension listed. Patients were instructed to write things they were currently doing in each dimension.  Education: LRT educated patients on ways to improve each dimension.  Education Outcome: Acknowledges education/In group clarification offered   Clinical Observations/Feedback: Patient wrote items in certain dimensions and wrote things like "fair" and "poor" in others. Patient did not contribute to group discussion.  Leonette Monarch, LRT/CTRS 05/06/2017 1:49 PM
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
## 2017-05-06 NOTE — Progress Notes (Signed)
Recreation Therapy Notes  INPATIENT RECREATION TR PLAN  Patient Details Name: Kelsey Maldonado MRN: [id number] DOB: 17-Jul-1981 Today's Date: 05/06/2017  Rec Therapy Plan Is patient appropriate for Therapeutic Recreation?: Yes Treatment times per week: At least once a week TR Treatment/Interventions: 1:1 session, Group participation (Comment) (Appropriate participation in daily recreational therapy tx)  Discharge Criteria Pt will be discharged from therapy if:: Treatment goals are met, Discharged Treatment plan/goals/alternatives discussed and agreed upon by:: Patient/family  Discharge Summary Short term goals set: See Care Plan Short term goals met: Complete Progress toward goals comments: One-to-one attended Which groups?: Wellness, Coping skills One-to-one attended: Self-esteem, coping skills Reason goals not met: N/A Therapeutic equipment acquired: None Reason patient discharged from therapy: Discharge from hospital Pt/family agrees with progress & goals achieved: Yes Date patient discharged from therapy: 05/06/17   Leonette Monarch, LRT/CTRS 05/06/2017, 3:27 PM

## 2017-05-06 NOTE — Plan of Care (Signed)
Problem: [facility] Participation in Recreation Therapeutic Interventions Goal: STG-Patient will demonstrate improved self esteem by identif STG: Self-Esteem - Within 4 treatment sessions, patient will verbalize at least 5 positive affirmation statements in each of 2 treatment sessions to increase self-esteem.  Outcome: Completed/Met Date Met: 05/06/17 Treatment Session 2; Completed 2 out of 2: At approximately 11:15 am, LRT met with patient in consultation room. Patient verbalized 5 positive affirmation statements. Patient reported it felt "awesome". LRT encouraged patient to continue saying positive affirmation statements.   Leonette Monarch, LRT/CTRS 07.02.18 3:24 pm Goal: STG-Patient will identify at least five coping skills for ** STG: Coping Skills - Within 4 treatment sessions, patient will verbalize at least 5 coping skills for substance abuse in each of 2 treatment sessions to decrease substance abuse.  Outcome: Completed/Met Date Met: 05/06/17 Treatment Session 2; Completed 2 out of 2: At approximately 11:15 am, LRT met with patient in consultation room. Patient verbalized 5 coping skills for substance abuse. LRT encouraged patient to use her coping skills instead of turning to substances.  Leonette Monarch, LRT/CTRS 07.02.18 3:26 pm

## 2017-05-06 NOTE — Progress Notes (Signed)
Pt calm, cooperative and pleasant.  Complains of anxiety, requests PRN hydroxyzine - given.  Med compliant with scheduled meds.  No behavioral issues.  Reports she feels ready for discharge.  Discharge orders received.  Discharge instructions reviewed with pt, including follow up appts, prescriptions and crisis help line. Pt verbalized understanding.  All belongings returned to pt.  Escorted off unit, no acute distress.

## 2018-02-03 DEATH — deceased

## 2021-01-24 NOTE — Telephone Encounter (Signed)
To close telephone encounter
